# Patient Record
Sex: Female | Born: 1955 | Race: White | Hispanic: No | Marital: Married | Smoking: Never smoker
Health system: Southern US, Community
[De-identification: ages and names within clinical notes are randomized; demographics above are authoritative.]

## PROBLEM LIST (undated history)

## (undated) DIAGNOSIS — M797 Fibromyalgia: Secondary | ICD-10-CM

## (undated) DIAGNOSIS — M47819 Spondylosis without myelopathy or radiculopathy, site unspecified: Secondary | ICD-10-CM

## (undated) DIAGNOSIS — I10 Essential (primary) hypertension: Secondary | ICD-10-CM

## (undated) DIAGNOSIS — R519 Headache, unspecified: Secondary | ICD-10-CM

## (undated) DIAGNOSIS — R51 Headache: Secondary | ICD-10-CM

## (undated) DIAGNOSIS — H5501 Congenital nystagmus: Secondary | ICD-10-CM

## (undated) DIAGNOSIS — G894 Chronic pain syndrome: Secondary | ICD-10-CM

## (undated) DIAGNOSIS — E7800 Pure hypercholesterolemia, unspecified: Secondary | ICD-10-CM

## (undated) DIAGNOSIS — G8929 Other chronic pain: Secondary | ICD-10-CM

## (undated) DIAGNOSIS — F419 Anxiety disorder, unspecified: Secondary | ICD-10-CM

## (undated) DIAGNOSIS — M5136 Other intervertebral disc degeneration, lumbar region: Secondary | ICD-10-CM

## (undated) DIAGNOSIS — M7500 Adhesive capsulitis of unspecified shoulder: Secondary | ICD-10-CM

## (undated) DIAGNOSIS — E559 Vitamin D deficiency, unspecified: Secondary | ICD-10-CM

## (undated) DIAGNOSIS — G43009 Migraine without aura, not intractable, without status migrainosus: Secondary | ICD-10-CM

## (undated) DIAGNOSIS — M48061 Spinal stenosis, lumbar region without neurogenic claudication: Secondary | ICD-10-CM

## (undated) DIAGNOSIS — W19XXXA Unspecified fall, initial encounter: Secondary | ICD-10-CM

## (undated) HISTORY — DX: Spondylosis without myelopathy or radiculopathy, site unspecified: M47.819

## (undated) HISTORY — DX: Other intervertebral disc degeneration, lumbar region: M51.36

## (undated) HISTORY — PX: ENDOMETRIAL ABLATION: SHX621

## (undated) HISTORY — DX: Essential (primary) hypertension: I10

## (undated) HISTORY — DX: Anxiety disorder, unspecified: F41.9

## (undated) HISTORY — DX: Unspecified fall, initial encounter: W19.XXXA

## (undated) HISTORY — DX: Headache, unspecified: R51.9

## (undated) HISTORY — DX: Fibromyalgia: M79.7

## (undated) HISTORY — DX: Migraine without aura, not intractable, without status migrainosus: G43.009

## (undated) HISTORY — DX: Pure hypercholesterolemia, unspecified: E78.00

## (undated) HISTORY — DX: Chronic pain syndrome: G89.4

## (undated) HISTORY — DX: Headache: R51

## (undated) HISTORY — DX: Congenital nystagmus: H55.01

## (undated) HISTORY — DX: Spinal stenosis, lumbar region without neurogenic claudication: M48.061

## (undated) HISTORY — DX: Adhesive capsulitis of unspecified shoulder: M75.00

## (undated) HISTORY — DX: Other chronic pain: G89.29

## (undated) HISTORY — DX: Vitamin D deficiency, unspecified: E55.9

## (undated) HISTORY — PX: TONSILLECTOMY AND ADENOIDECTOMY: SUR1326

---

## 2010-11-05 DIAGNOSIS — E7800 Pure hypercholesterolemia, unspecified: Secondary | ICD-10-CM | POA: Insufficient documentation

## 2010-11-05 DIAGNOSIS — E559 Vitamin D deficiency, unspecified: Secondary | ICD-10-CM

## 2010-11-05 DIAGNOSIS — M797 Fibromyalgia: Secondary | ICD-10-CM | POA: Insufficient documentation

## 2010-11-05 DIAGNOSIS — I10 Essential (primary) hypertension: Secondary | ICD-10-CM | POA: Insufficient documentation

## 2010-11-05 HISTORY — DX: Fibromyalgia: M79.7

## 2010-11-05 HISTORY — DX: Vitamin D deficiency, unspecified: E55.9

## 2010-11-05 HISTORY — DX: Pure hypercholesterolemia, unspecified: E78.00

## 2011-02-11 DIAGNOSIS — F419 Anxiety disorder, unspecified: Secondary | ICD-10-CM

## 2011-02-11 HISTORY — DX: Anxiety disorder, unspecified: F41.9

## 2011-05-29 DIAGNOSIS — F4321 Adjustment disorder with depressed mood: Secondary | ICD-10-CM | POA: Insufficient documentation

## 2014-12-03 ENCOUNTER — Other Ambulatory Visit: Payer: Self-pay | Admitting: Orthopedic Surgery

## 2014-12-03 DIAGNOSIS — G8929 Other chronic pain: Secondary | ICD-10-CM

## 2014-12-03 DIAGNOSIS — M545 Low back pain: Principal | ICD-10-CM

## 2014-12-14 ENCOUNTER — Other Ambulatory Visit: Payer: Self-pay | Admitting: Orthopedic Surgery

## 2014-12-14 ENCOUNTER — Ambulatory Visit
Admission: RE | Admit: 2014-12-14 | Discharge: 2014-12-14 | Disposition: A | Discharge status: Home / Self Care | Payer: Worker's Compensation | Source: Ambulatory Visit | Attending: Orthopedic Surgery | Admitting: Orthopedic Surgery

## 2014-12-14 DIAGNOSIS — M545 Low back pain: Principal | ICD-10-CM

## 2014-12-14 DIAGNOSIS — G8929 Other chronic pain: Secondary | ICD-10-CM

## 2014-12-14 MED ORDER — IOHEXOL 180 MG/ML  SOLN
1.0000 mL | Freq: Once | INTRAMUSCULAR | Status: DC | PRN
  Administered 2014-12-14: 1 mL via INTRA_ARTICULAR

## 2014-12-14 MED ORDER — METHYLPREDNISOLONE ACETATE 40 MG/ML INJ SUSP (RADIOLOG
120.0000 mg | Freq: Once | INTRAMUSCULAR | Status: AC
  Administered 2014-12-14: 120 mg via INTRA_ARTICULAR

## 2014-12-14 NOTE — Discharge Instructions (Signed)

## 2015-01-11 ENCOUNTER — Other Ambulatory Visit: Payer: Self-pay | Admitting: Orthopedic Surgery

## 2015-01-11 DIAGNOSIS — M48061 Spinal stenosis, lumbar region without neurogenic claudication: Secondary | ICD-10-CM

## 2015-01-23 ENCOUNTER — Ambulatory Visit
Admission: RE | Admit: 2015-01-23 | Discharge: 2015-01-23 | Disposition: A | Discharge status: Home / Self Care | Payer: Worker's Compensation | Source: Ambulatory Visit | Attending: Orthopedic Surgery | Admitting: Orthopedic Surgery

## 2015-01-23 ENCOUNTER — Other Ambulatory Visit: Payer: Self-pay | Admitting: Orthopedic Surgery

## 2015-01-23 DIAGNOSIS — M48061 Spinal stenosis, lumbar region without neurogenic claudication: Secondary | ICD-10-CM

## 2015-01-23 NOTE — Discharge Instructions (Signed)

## 2015-09-23 ENCOUNTER — Ambulatory Visit (INDEPENDENT_AMBULATORY_CARE_PROVIDER_SITE_OTHER): Payer: BLUE CROSS/BLUE SHIELD | Admitting: Physician Assistant

## 2015-09-23 ENCOUNTER — Encounter: Payer: Self-pay | Admitting: Physician Assistant

## 2015-09-23 ENCOUNTER — Ambulatory Visit (INDEPENDENT_AMBULATORY_CARE_PROVIDER_SITE_OTHER): Payer: BLUE CROSS/BLUE SHIELD

## 2015-09-23 VITALS — BP 162/87 | HR 98 | Ht 64.0 in | Wt 248.0 lb

## 2015-09-23 DIAGNOSIS — M48061 Spinal stenosis, lumbar region without neurogenic claudication: Secondary | ICD-10-CM

## 2015-09-23 DIAGNOSIS — W19XXXA Unspecified fall, initial encounter: Secondary | ICD-10-CM

## 2015-09-23 DIAGNOSIS — M79601 Pain in right arm: Secondary | ICD-10-CM

## 2015-09-23 DIAGNOSIS — M25511 Pain in right shoulder: Secondary | ICD-10-CM | POA: Diagnosis not present

## 2015-09-23 DIAGNOSIS — M7531 Calcific tendinitis of right shoulder: Secondary | ICD-10-CM | POA: Diagnosis not present

## 2015-09-23 DIAGNOSIS — M7501 Adhesive capsulitis of right shoulder: Secondary | ICD-10-CM

## 2015-09-23 DIAGNOSIS — M47819 Spondylosis without myelopathy or radiculopathy, site unspecified: Secondary | ICD-10-CM

## 2015-09-23 DIAGNOSIS — M4806 Spinal stenosis, lumbar region: Secondary | ICD-10-CM

## 2015-09-23 DIAGNOSIS — G8929 Other chronic pain: Secondary | ICD-10-CM

## 2015-09-23 DIAGNOSIS — G894 Chronic pain syndrome: Secondary | ICD-10-CM

## 2015-09-23 DIAGNOSIS — M5136 Other intervertebral disc degeneration, lumbar region: Secondary | ICD-10-CM

## 2015-09-23 DIAGNOSIS — M533 Sacrococcygeal disorders, not elsewhere classified: Secondary | ICD-10-CM

## 2015-09-23 MED ORDER — HYDROCODONE-ACETAMINOPHEN 5-325 MG PO TABS: 1.0000 | ORAL_TABLET | Freq: Three times a day (TID) | ORAL | Status: DC | PRN

## 2015-09-23 MED ORDER — DICLOFENAC SODIUM 75 MG PO TBEC: 75.0000 mg | DELAYED_RELEASE_TABLET | Freq: Two times a day (BID) | ORAL | Status: DC

## 2015-09-23 NOTE — Patient Instructions (Signed)
Adhesive Capsulitis Adhesive capsulitis is inflammation of the tendons and ligaments that surround the shoulder joint (shoulder capsule). This condition causes the shoulder to become stiff and painful to move. Adhesive capsulitis is also called frozen shoulder. CAUSES This condition may be caused by:  An injury to the shoulder joint.  Straining the shoulder.  Not moving the shoulder for a period of time. This can happen if your arm was injured or in a sling.  Long-standing health problems, such as:  Diabetes.  Thyroid problems.  Heart disease.  Stroke.  Rheumatoid arthritis.  Lung disease. In some cases, the cause may not be known. RISK FACTORS This condition is more likely to develop in:  Women.  People who are older than 58 years of age. SYMPTOMS Symptoms of this condition include:  Pain in the shoulder when moving the arm. There may also be pain when parts of the shoulder are touched. The pain is worse at night or when at rest.  Soreness or aching in the shoulder.  Inability to move the shoulder normally.  Muscle spasms. DIAGNOSIS This condition is diagnosed with a physical exam and imaging tests, such as an X-ray or MRI. TREATMENT This condition may be treated with:  Treatment of the underlying cause or condition.  Physical therapy. This involves performing exercises to get the shoulder moving again.  Medicine. Medicine may be given to relieve pain, inflammation, or muscle spasms.  Steroid injections into the shoulder joint.  Shoulder manipulation. This is a procedure to move the shoulder into another position. It is done after you are given a medicine to make you fall asleep (general anesthetic). The joint may also be injected with salt water at high pressure to break down scarring.  Surgery. This may be done in severe cases when other treatments have failed. Although most people recover completely from adhesive capsulitis, some may not regain the full  movement of the shoulder. HOME CARE INSTRUCTIONS  Take over-the-counter and prescription medicines only as told by your health care provider.  If you are being treated with physical therapy, follow instructions from your physical therapist.  Avoid exercises that put a lot of demand on your shoulder, such as throwing. These exercises can make pain worse.  If directed, apply ice to the injured area:  Put ice in a plastic bag.  Place a towel between your skin and the bag.  Leave the ice on for 20 minutes, 2-3 times per day. SEEK MEDICAL CARE IF:  You develop new symptoms.  Your symptoms get worse.   This information is not intended to replace advice given to you by your health care provider. Make sure you discuss any questions you have with your health care provider.   Document Released: 12/28/2008 Document Revised: 11/21/2014 Document Reviewed: 06/25/2014 Elsevier Interactive Patient Education 2016 [employer]vier Inc[employer]  

## 2015-09-23 NOTE — Progress Notes (Signed)
Subjective:    Patient ID: Sheri King, female    DOB: [birth year], 60 y.o.   MRN: [id number]  HPI  Pt is a 60 yo female who presents to the clinic to establish care.   .. Active Ambulatory Problems    Diagnosis Date Noted  . Anxiety 02/11/2011  . Fibromyalgia 11/05/2010  . Aggrieved 05/29/2011  . Pure hypercholesterolemia 11/05/2010  . Essential (primary) hypertension 11/05/2010  . Avitaminosis D 11/05/2010   Resolved Ambulatory Problems    Diagnosis Date Noted  . No Resolved Ambulatory Problems   Past Medical History  Diagnosis Date  . Hypertension    .Marland Kitchen Family History  Problem Relation Age of Onset  . Diabetes Mother    .Marland Kitchen Social History   Social History  . Marital Status: Married    Spouse Name: N/A  . Number of Children: N/A  . Years of Education: N/A   Occupational History  . Not on file.   Social History Main Topics  . Smoking status: Never Smoker   . Smokeless tobacco: Never Used  . Alcohol Use: No  . Drug Use: No  . Sexual Activity: Not on file   Other Topics Concern  . Not on file   Social History Narrative   She comes in today with complaint of right shoulder pain and upper arm pain after fall 4 days ago in the bathtub. She lost of footing and fel forward and hit front right shoulder. She has been in pain since. Ibuprofen is not helping. She has not moved her shoulder for 4 days. Rates pain 10/10. All movement is painful.     Review of Systems  All other systems reviewed and are negative.      Objective:   Physical Exam  Constitutional: She is oriented to person, place, and time. She appears well-developed and well-nourished.  HENT:  Head: Normocephalic and atraumatic.  Cardiovascular: Normal rate, regular rhythm and normal heart sounds.   Pulmonary/Chest: Effort normal and breath sounds normal.  Musculoskeletal:  Right shoulder: Active ROM is decreased signifcantly to about 20-40 degrees.  Passive ROm is decreased due to pain at  about 80 degrees. Resistance met with effort to create full ROM.  Pain to palpation over anterior shoulder and acromion.  Strength decreased 3/5 of right arm.   Neurological: She is alert and oriented to person, place, and time.  Psychiatric: She has a normal mood and affect. Her behavior is normal.          Assessment & Plan:  Fall/right shoulder pain/right arm pain- xray was negative for fracture or dislocation. Exam was positive for signs of adhesive capsulitis. She does admit to not moving shoulder since fall 4 days ago. Exercises given. Encouraged her to follow up with sports medicine and get an injection. Offered PT and declined today. Diclofenac bid for inflammation. She was persistent about needing pain medication. She admited to going to pain clinic but that she could not be seen until august. norco given 30 tablets and explained there would be no refills.  Follow up if not improving in 2 weeks with sports medicine.   Check registry and no controlled substances other than butran in registry.   Hypertension- elevated today. Per pt she feels like due to pain. maxide and verapamil to continue.   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  Per record she is only on Butrans for  pain. I worry that she will put her pain contract at risk with taking hydrocodone. Will call patient and encourage her to communicate this with her pain clinic.

## 2015-09-25 ENCOUNTER — Telehealth: Payer: Self-pay | Admitting: Physician Assistant

## 2015-09-25 DIAGNOSIS — M79601 Pain in right arm: Secondary | ICD-10-CM | POA: Insufficient documentation

## 2015-09-25 DIAGNOSIS — M5136 Other intervertebral disc degeneration, lumbar region: Secondary | ICD-10-CM | POA: Insufficient documentation

## 2015-09-25 DIAGNOSIS — M47816 Spondylosis without myelopathy or radiculopathy, lumbar region: Secondary | ICD-10-CM | POA: Insufficient documentation

## 2015-09-25 DIAGNOSIS — M25511 Pain in right shoulder: Secondary | ICD-10-CM | POA: Insufficient documentation

## 2015-09-25 DIAGNOSIS — W19XXXA Unspecified fall, initial encounter: Secondary | ICD-10-CM | POA: Insufficient documentation

## 2015-09-25 DIAGNOSIS — M7500 Adhesive capsulitis of unspecified shoulder: Secondary | ICD-10-CM

## 2015-09-25 DIAGNOSIS — M47819 Spondylosis without myelopathy or radiculopathy, site unspecified: Secondary | ICD-10-CM

## 2015-09-25 DIAGNOSIS — M533 Sacrococcygeal disorders, not elsewhere classified: Secondary | ICD-10-CM

## 2015-09-25 DIAGNOSIS — G8929 Other chronic pain: Secondary | ICD-10-CM | POA: Insufficient documentation

## 2015-09-25 DIAGNOSIS — G894 Chronic pain syndrome: Secondary | ICD-10-CM

## 2015-09-25 DIAGNOSIS — M48061 Spinal stenosis, lumbar region without neurogenic claudication: Secondary | ICD-10-CM | POA: Insufficient documentation

## 2015-09-25 HISTORY — DX: Chronic pain syndrome: G89.4

## 2015-09-25 HISTORY — DX: Spondylosis without myelopathy or radiculopathy, site unspecified: M47.819

## 2015-09-25 HISTORY — DX: Spinal stenosis, lumbar region without neurogenic claudication: M48.061

## 2015-09-25 HISTORY — DX: Other intervertebral disc degeneration, lumbar region: M51.36

## 2015-09-25 HISTORY — DX: Unspecified fall, initial encounter: W19.XXXA

## 2015-09-25 HISTORY — DX: Adhesive capsulitis of unspecified shoulder: M75.00

## 2015-09-25 NOTE — Telephone Encounter (Signed)
Patient notified and voiced understanding.

## 2015-09-25 NOTE — Telephone Encounter (Signed)
Call pt: after looking in care everywhere I see where you have a pain contract with Dr[facility]. At your visit here you were given a small quanity of norco for acute pain. I am concerned they will check registry at next visit and release you. You may need to call them and let them know of situation. When in pain contract you cannot get any other pain medications from other providers.

## 2015-10-21 ENCOUNTER — Other Ambulatory Visit: Payer: Self-pay | Admitting: Physician Assistant

## 2015-10-22 ENCOUNTER — Telehealth: Payer: Self-pay | Admitting: *Deleted

## 2015-10-22 NOTE — Telephone Encounter (Signed)
PA submitted for verapamil. Faxing form to our office

## 2015-11-07 NOTE — Telephone Encounter (Signed)
Form never received. Called rep and he showed me how to download form. Filled it out and faxed

## 2015-11-19 ENCOUNTER — Other Ambulatory Visit: Payer: Self-pay | Admitting: Physician Assistant

## 2016-01-12 ENCOUNTER — Other Ambulatory Visit: Payer: Self-pay | Admitting: Physician Assistant

## 2016-02-27 ENCOUNTER — Encounter: Payer: Self-pay | Admitting: Sports Medicine

## 2016-02-27 ENCOUNTER — Ambulatory Visit (INDEPENDENT_AMBULATORY_CARE_PROVIDER_SITE_OTHER): Payer: BLUE CROSS/BLUE SHIELD | Admitting: Sports Medicine

## 2016-02-27 DIAGNOSIS — A084 Viral intestinal infection, unspecified: Secondary | ICD-10-CM

## 2016-02-27 MED ORDER — DIPHENOXYLATE-ATROPINE 2.5-0.025 MG PO TABS
ORAL_TABLET | ORAL | 3 refills | Status: DC
Start: 2016-02-27 — End: 2016-10-09

## 2016-02-27 MED ORDER — PROMETHAZINE HCL 25 MG PO TABS: 25.0000 mg | ORAL_TABLET | Freq: Four times a day (QID) | ORAL | 3 refills | Status: DC | PRN

## 2016-02-27 MED ORDER — ONDANSETRON 8 MG PO TBDP: 8.0000 mg | ORAL_TABLET | Freq: Three times a day (TID) | ORAL | 3 refills | Status: DC | PRN

## 2016-02-27 MED ORDER — TRAMADOL HCL 50 MG PO TABS: ORAL_TABLET | ORAL | 0 refills | Status: DC

## 2016-02-27 NOTE — Assessment & Plan Note (Signed)
Overall benign exam, not dehydrated. Phenergan, Zofran, tramadol for her headache, Lomotil for diarrhea. Return if no better in 2 weeks.

## 2016-02-27 NOTE — Progress Notes (Signed)
  Subjective:    CC: Nausea, vomiting, diarrhea  HPI: For the past 2 weeks this pleasant 60 year old female has had mild nausea, vomiting, diarrhea, several times per day, no hematochezia, hematemesis, no melena. Low-grade subjective fevers, headache, mild body aches. Only minimal abdominal pain. Symptoms are moderate, persistent.  Past medical history:  Negative.  See flowsheet/record as well for more information.  Surgical history: Negative.  See flowsheet/record as well for more information.  Family history: Negative.  See flowsheet/record as well for more information.  Social history: Negative.  See flowsheet/record as well for more information.  Allergies, and medications have been entered into the medical record, reviewed, and no changes needed.   Review of Systems: No fevers, chills, night sweats, weight loss, chest pain, or shortness of breath.   Objective:    General: Well Developed, well nourished, and in no acute distress.  Neuro: Alert and oriented x3, extra-ocular muscles intact, sensation grossly intact.  HEENT: Normocephalic, atraumatic, pupils equal round reactive to light, neck supple, no masses, no lymphadenopathy, thyroid nonpalpable.  Skin: Warm and dry, no rashes. Cardiac: Regular rate and rhythm, no murmurs rubs or gallops, no lower extremity edema.  Respiratory: Clear to auscultation bilaterally. Not using accessory muscles, speaking in full sentences. Abdomen: Diffusely tender but mild, no guarding, rigidity, rebound tenderness, no palpable masses, no bowel sounds, no costovertebral angle pain.  Impression and Recommendations:    Viral gastroenteritis Overall benign exam, not dehydrated. Phenergan, Zofran, tramadol for her headache, Lomotil for diarrhea. Return if no better in 2 weeks.  I spent 25 minutes with this patient, greater than 50% was face-to-face time counseling regarding the above diagnoses

## 2016-03-04 ENCOUNTER — Telehealth: Payer: Self-pay | Admitting: Sports Medicine

## 2016-03-04 ENCOUNTER — Other Ambulatory Visit: Payer: Self-pay | Admitting: Physician Assistant

## 2016-03-04 DIAGNOSIS — A084 Viral intestinal infection, unspecified: Secondary | ICD-10-CM

## 2016-03-04 MED ORDER — TRAMADOL HCL 50 MG PO TABS: ORAL_TABLET | ORAL | 0 refills | Status: DC

## 2016-03-04 NOTE — Telephone Encounter (Signed)
Mrs. Sheri King is wanting to get a refill on tramadol b/c she said she is having slight relief but she is still consistent with the headaches and diarrhea, and upset stomach. Contact patient ASAP to discuss her questions.

## 2016-03-04 NOTE — Telephone Encounter (Signed)
Please encourage pt that viral gastroenteritis symptoms can last up to 1-2 weeks. Stay hydrated. Will fax tramadol to pharmacy.

## 2016-03-04 NOTE — Telephone Encounter (Signed)
To PCP.  Though its only been a few days and this can last a week or 2.

## 2016-05-01 ENCOUNTER — Ambulatory Visit (INDEPENDENT_AMBULATORY_CARE_PROVIDER_SITE_OTHER): Payer: BLUE CROSS/BLUE SHIELD | Admitting: Physician Assistant

## 2016-05-01 ENCOUNTER — Encounter: Payer: Self-pay | Admitting: Physician Assistant

## 2016-05-01 DIAGNOSIS — H5501 Congenital nystagmus: Secondary | ICD-10-CM

## 2016-05-01 DIAGNOSIS — G43019 Migraine without aura, intractable, without status migrainosus: Secondary | ICD-10-CM | POA: Diagnosis not present

## 2016-05-01 HISTORY — DX: Congenital nystagmus: H55.01

## 2016-05-01 MED ORDER — DEXAMETHASONE SODIUM PHOSPHATE 4 MG/ML IJ SOLN
4.0000 mg | Freq: Once | INTRAMUSCULAR | Status: AC
  Administered 2016-05-01: 4 mg via INTRAMUSCULAR

## 2016-05-01 MED ORDER — KETOROLAC TROMETHAMINE 30 MG/ML IJ SOLN
30.0000 mg | Freq: Once | INTRAMUSCULAR | Status: AC
  Administered 2016-05-01: 30 mg via INTRAMUSCULAR

## 2016-05-01 MED ORDER — TRAMADOL HCL 50 MG PO TABS: ORAL_TABLET | ORAL | 0 refills | Status: DC

## 2016-05-01 MED ORDER — SUMATRIPTAN SUCCINATE 6 MG/0.5ML ~~LOC~~ SOLN
6.0000 mg | Freq: Once | SUBCUTANEOUS | Status: AC
  Administered 2016-05-01: 6 mg via SUBCUTANEOUS

## 2016-05-01 NOTE — Patient Instructions (Signed)

## 2016-05-01 NOTE — Progress Notes (Addendum)
Subjective:     Patient ID: Sheri King, female   DOB: [birth year], 61 y.o.   MRN: [id number]  HPI  Patient states she's had a headache for 3 days without relief. Has taken 800 mg Advil, 1000 mg Tylenol, and 650 mg Aspirin without any relief. She has also tried cold compresses, massages, and warm showers. She states the headache started behind her eyes and forehead and progressed to the back of her head and neck. She says the onset of the headache was unprovoked and rates it as a 10/10. Has a history of migraines in the past however has been off preventative therapy for several years. Recently discontinued Amitriptyline for fibromyalgia. Endorses photophobia and phonophobia but denies visual changes, nausea, vomiting. States Toradol has been administered in the past without relief.  Active Ambulatory Problems    Diagnosis Date Noted  . Anxiety 02/11/2011  . Fibromyalgia 11/05/2010  . Aggrieved 05/29/2011  . Pure hypercholesterolemia 11/05/2010  . Essential (primary) hypertension 11/05/2010  . Avitaminosis D 11/05/2010  . Right arm pain 09/25/2015  . Right shoulder pain 09/25/2015  . Fall 09/25/2015  . Facet arthropathy of spine 09/25/2015  . 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 DDD (degenerative disc disease), lumbar 09/25/2015  . Spinal stenosis of lumbar region 09/25/2015  . Chronic pain syndrome 09/25/2015  . Adhesive capsulitis 09/25/2015  . Viral gastroenteritis 02/27/2016  . 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 No Resolved Ambulatory Problems   Past Medical History:  Diagnosis Date  . Hypertension     Review of Systems  Constitutional: Negative for chills, diaphoresis, fatigue, fever and unexpected weight change.  HENT: Negative for congestion, facial swelling, sinus pain, sinus pressure, sneezing and tinnitus.   Eyes: Positive for photophobia and pain. Negative for discharge and visual disturbance.   Respiratory: Negative for cough, chest tightness, shortness of breath and wheezing.   Cardiovascular: Negative for chest pain and palpitations.  Gastrointestinal: Negative for blood in stool, diarrhea, nausea and vomiting.  Musculoskeletal: Positive for neck pain. Negative for arthralgias, gait problem, myalgias and neck stiffness.  Skin: Negative for color change, rash and wound.  Neurological: Positive for headaches. Negative for dizziness, tremors, seizures, syncope, speech difficulty, weakness, light-headedness and numbness.       Objective:   Physical Exam  Constitutional: She is oriented to person, place, and time. She appears well-developed and well-nourished.  Somewhat distressed and sitting with lights partially off.  HENT:  Head: Normocephalic and atraumatic.  Right Ear: External ear normal.  Left Ear: External ear normal.  Nose: Nose normal. Right sinus exhibits no maxillary sinus tenderness and no frontal sinus tenderness. Left sinus exhibits no maxillary sinus tenderness and no frontal sinus tenderness.  Mouth/Throat: Uvula is midline and oropharynx is clear and moist.  Eyes: Conjunctivae are normal. Pupils are equal, round, and reactive to light. Right eye exhibits nystagmus. Left eye exhibits nystagmus.  Neck: Normal range of motion. Neck supple.  Cardiovascular: Normal rate and regular rhythm.  Exam reveals no gallop and no friction rub.   No murmur heard. Pulmonary/Chest: Effort normal and breath sounds normal. She has no wheezes. She has no rales.  Abdominal: Soft. Bowel sounds are normal. She exhibits no distension. There is no tenderness. There is no guarding.  Musculoskeletal: Normal range of motion.  Muscle strength 5/5 on bilateral upper and lower extremities.  Lymphadenopathy:    She has no cervical adenopathy.  Neurological: She is alert and  oriented to person, place, and time. She has normal reflexes. No cranial nerve deficit. Coordination normal.  Skin: Skin  is warm and dry. No rash noted. No erythema.  Psychiatric: She has a normal mood and affect. Her behavior is normal.     Assessment/Plan:  Sheri King was seen today for headache.  Diagnoses and all orders for this visit:  Intractable migraine without aura and without status migrainosus -     traMADol (ULTRAM) 50 MG tablet; 1-2 tabs by mouth Q8 hours, maximum 6 tabs per day. -     ketorolac (TORADOL) 30 MG/ML injection 30 mg; Inject 1 mL (30 mg total) into the muscle once. -     dexamethasone (DECADRON) injection 4 mg; Inject 1 mL (4 mg total) into the muscle once. -     SUMAtriptan (IMITREX) injection 6 mg; Inject 0.5 mLs (6 mg total) into the skin once.  No focal neurologic deficits noted or reported at this time so this is most likely a migraine given her history and symptoms. Administered Ketorolac, Dexamethasone, and Imitrex in office to help with pain and sent home with Tramadol given history of not responding well to Ketorolac. Patient instructed to go to emergency department if she notices any numbness, weakness, facial droop, difficulty speaking, dizziness, gait instability, or any other neurologic deficits. Also informed to seek help if pain increases or does not go away.  Discussed if she experienced another migraine in the next month she should consider restarting her Amitriptyline as prophylactic therapy.

## 2016-05-26 ENCOUNTER — Other Ambulatory Visit: Payer: Self-pay | Admitting: Physician Assistant

## 2016-07-02 ENCOUNTER — Other Ambulatory Visit: Payer: Self-pay | Admitting: *Deleted

## 2016-07-02 MED ORDER — VERAPAMIL HCL ER 300 MG PO CP24: ORAL_CAPSULE | ORAL | 0 refills | Status: DC

## 2016-07-10 ENCOUNTER — Encounter: Payer: Self-pay | Admitting: *Deleted

## 2016-07-10 ENCOUNTER — Emergency Department
Admission: EM | Admit: 2016-07-10 | Discharge: 2016-07-10 | Disposition: A | Discharge status: Home / Self Care | Payer: BLUE CROSS/BLUE SHIELD | Source: Home / Self Care | Attending: Family Medicine | Admitting: Family Medicine

## 2016-07-10 DIAGNOSIS — R519 Headache, unspecified: Secondary | ICD-10-CM

## 2016-07-10 DIAGNOSIS — R197 Diarrhea, unspecified: Secondary | ICD-10-CM | POA: Diagnosis not present

## 2016-07-10 DIAGNOSIS — A084 Viral intestinal infection, unspecified: Secondary | ICD-10-CM

## 2016-07-10 DIAGNOSIS — R51 Headache: Secondary | ICD-10-CM

## 2016-07-10 DIAGNOSIS — R112 Nausea with vomiting, unspecified: Secondary | ICD-10-CM | POA: Diagnosis not present

## 2016-07-10 LAB — POCT CBC W AUTO DIFF (K'VILLE URGENT CARE)

## 2016-07-10 MED ORDER — METOCLOPRAMIDE HCL 5 MG/ML IJ SOLN
5.0000 mg | Freq: Once | INTRAMUSCULAR | Status: AC
  Administered 2016-07-10: 5 mg via INTRAMUSCULAR

## 2016-07-10 MED ORDER — KETOROLAC TROMETHAMINE 10 MG PO TABS: 10.0000 mg | ORAL_TABLET | Freq: Four times a day (QID) | ORAL | 0 refills | Status: DC | PRN

## 2016-07-10 MED ORDER — KETOROLAC TROMETHAMINE 60 MG/2ML IJ SOLN
60.0000 mg | Freq: Once | INTRAMUSCULAR | Status: AC
  Administered 2016-07-10: 60 mg via INTRAMUSCULAR

## 2016-07-10 MED ORDER — PROMETHAZINE HCL 25 MG PO TABS: 25.0000 mg | ORAL_TABLET | Freq: Four times a day (QID) | ORAL | 0 refills | Status: DC | PRN

## 2016-07-10 MED ORDER — DEXAMETHASONE SODIUM PHOSPHATE 10 MG/ML IJ SOLN
10.0000 mg | Freq: Once | INTRAMUSCULAR | Status: AC
  Administered 2016-07-10: 10 mg via INTRAMUSCULAR

## 2016-07-10 NOTE — ED Provider Notes (Signed)
[facility]    CSN: [id number] Arrival date & time: 07/10/16  1157     History   Chief Complaint Chief Complaint  Patient presents with  . Abdominal Pain  . Diarrhea  . Sinus Problem  . Headache    HPI Sheri King is a 61 y.o. female.   Three days ago patient developed headache, nausea/vomiting, diarrhea, and chills.  The vomiting resolved about one hour.  She has now developed sinus congestion, cough, and her ears hurt.  She has a history of seasonal rhinitis.   The history is provided by the patient.    Past Medical History:  Diagnosis Date  . Hypertension     Patient Active Problem List   Diagnosis Date Noted  . 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 Viral gastroenteritis 02/27/2016  . Right arm pain 09/25/2015  . Right shoulder pain 09/25/2015  . Fall 09/25/2015  . Facet arthropathy of spine (HCC) 09/25/2015  . 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 DDD (degenerative disc disease), lumbar 09/25/2015  . Spinal stenosis of lumbar region 09/25/2015  . Chronic pain syndrome 09/25/2015  . Adhesive capsulitis 09/25/2015  . Aggrieved 05/29/2011  . Anxiety 02/11/2011  . Fibromyalgia 11/05/2010  . Pure hypercholesterolemia 11/05/2010  . Essential (primary) hypertension 11/05/2010  . Avitaminosis D 11/05/2010    Past Surgical History:  Procedure Laterality Date  . TONSILLECTOMY AND ADENOIDECTOMY     at age 5    OB History    No data available       Home Medications    Prior to Admission medications   Medication Sig Start Date End Date Taking? Authorizing Provider  amitriptyline (ELAVIL) 150 MG tablet  05/16/15   Historical Provider, MD  buprenorphine (BUTRANS) 10 MCG/HR PTWK patch Place 10 mcg onto the skin once a week.    Historical Provider, MD  diclofenac (VOLTAREN) 75 MG EC tablet TAKE 1 TABLET BY MOUTH TWICE A DAY 01/13/16   Jade L Breeback, PA-C  diphenoxylate-atropine (LOMOTIL) 2.5-0.025 MG tablet One to 2 tablets by mouth 4  times a day as needed for diarrhea. 02/27/16   Monica Becton, MD  Eszopiclone 3 MG TABS Take 3 mg by mouth at bedtime as needed. 09/12/15   Historical Provider, MD  ketorolac (TORADOL) 10 MG tablet Take 1 tablet (10 mg total) by mouth every 6 (six) hours as needed. 07/10/16   Lattie Haw, MD  ondansetron (ZOFRAN-ODT) 8 MG disintegrating tablet Take 1 tablet (8 mg total) by mouth every 8 (eight) hours as needed for nausea. 02/27/16   Monica Becton, MD  promethazine (PHENERGAN) 25 MG tablet Take 1 tablet (25 mg total) by mouth every 6 (six) hours as needed for nausea. 07/10/16   Lattie Haw, MD  traMADol (ULTRAM) 50 MG tablet 1-2 tabs by mouth Q8 hours, maximum 6 tabs per day. 05/01/16   Jomarie Longs, PA-C  triamterene-hydrochlorothiazide (MAXZIDE-25) 37.5-25 MG tablet  09/21/15   Historical Provider, MD  Verapamil HCl CR 300 MG CP24 TAKE ONE CAPSULE BY MOUTH EVERY MORNING BEFORE BREAKFAST 07/02/16   Jomarie Longs, PA-C    Family History Family History  Problem Relation Age of Onset  . Diabetes Mother     Social History Social History  Substance Use Topics  . Smoking status: Never Smoker  . Smokeless tobacco: Never Used  . Alcohol use No     Allergies   Tramadol; Codeine; Latex; Nexium [esomeprazole magnesium]; and Nsaids   Review of  Systems Review of Systems No sore throat + mild cough No pleuritic pain No wheezing + nasal congestion + post-nasal drainage + sinus pain/pressure No itchy/red eyes ? earache No hemoptysis No SOB No fever, + chills + nausea + vomiting resolved No abdominal pain + diarrhea No urinary symptoms No skin rash + fatigue No myalgias + headache Used OTC meds without relief   Physical Exam Triage Vital Signs ED Triage Vitals [07/10/16 1234]  Enc Vitals Group     BP 129/76     Pulse Rate 82     Resp      Temp 99.1 F (37.3 C)     Temp Source Oral     SpO2 96 %     Weight 257 lb (116.6 kg)     Height      Head  Circumference      Peak Flow      Pain Score 9     Pain Loc      Pain Edu?      Excl. in GC?    No data found.   Updated Vital Signs BP 129/76 (BP Location: Left Arm)   Pulse 82   Temp 99.1 F (37.3 C) (Oral)   Wt 257 lb (116.6 kg)   SpO2 96%   BMI 44.11 kg/m   Visual Acuity Right Eye Distance:   Left Eye Distance:   Bilateral Distance:    Right Eye Near:   Left Eye Near:    Bilateral Near:     Physical Exam Nursing notes and Vital Signs reviewed. Appearance:  Patient appears stated age, and in no acute distress Eyes:  Pupils are equal, round, and reactive to light and accomodation.  Extraocular movement is intact.  Conjunctivae are not inflamed  Ears:  Canals normal.  Tympanic membranes normal.  Nose:  Mildly congested turbinates.  No sinus tenderness.   Mouth/Pharynx:  Normal; moist mucous membranes  Neck:  Supple.  No adenopathy. Lungs:  Clear to auscultation.  Breath sounds are equal.  Moving air well. Heart:  Regular rate and rhythm without murmurs, rubs, or gallops.  Abdomen:  Nontender without masses or hepatosplenomegaly.  Bowel sounds are present.  No CVA or flank tenderness.  Extremities:  No edema.  Skin:  No rash present.   Neurologic:  Cranial nerves 2 through 12 are normal.  Patellar, achilles, and elbow reflexes are normal.  Cerebellar function is intact (finger-to-nose and rapid alternating hand movement).  Gait and station are normal.  Grip strength symmetric bilaterally.   UC Treatments / Results  Labs (all labs ordered are listed, but only abnormal results are displayed)  Labs Reviewed  POCT CBC W AUTO DIFF ([facility]):  WBC 9.9; LY 32.1; MO 2.9; GR 65.0; Hgb 14.1; Platelets 352    Tympanometry:  Right ear tympanogram normal; Left ear tympanogram positive peak pressure  EKG  EKG Interpretation None       Radiology No results found.  Procedures Procedures (including critical care time)  Medications Ordered in  UC Medications  ketorolac (TORADOL) injection 60 mg (60 mg Intramuscular Given 07/10/16 1426)  dexamethasone (DECADRON) injection 10 mg (10 mg Intramuscular Given 07/10/16 1426)  metoCLOPramide (REGLAN) injection 5 mg (5 mg Intramuscular Given 07/10/16 1426)     Initial Impression / Assessment and Plan / UC Course  I have reviewed the triage vital signs and the nursing notes.  Pertinent labs & imaging results that were available during my care of the patient were reviewed by  me and considered in my medical decision making (see chart for details).    Normal WBC reassuring. Suspect viral gastroenteritis. Administered Toradol  IM, Decadron  IM, and Reglan  IM.  Rx for Toradol  PO. Rx for Phenergan for nausea. Begin clear liquids (Pedialyte while having diarrhea) until improved, then advance to a [employer] (Bananas, Rice, Applesauce, Toast).  Then gradually resume a regular diet when tolerated.  Avoid milk products until well.  When stools become more formed, may take Imodium (loperamide) once or twice daily to decrease stool frequency.  If symptoms become significantly worse during the night or over the weekend, proceed to the local emergency room.  Followup with Family Doctor if not improved in 3 days.    Final Clinical Impressions(s) / UC Diagnoses   Final diagnoses:  Intractable headache, unspecified chronicity pattern, unspecified headache type  Nausea vomiting and diarrhea    New Prescriptions New Prescriptions   KETOROLAC (TORADOL) 10 MG TABLET    Take 1 tablet (10 mg total) by mouth every 6 (six) hours as needed.     Lattie Haw, MD 07/19/16 [phone]

## 2016-07-10 NOTE — Discharge Instructions (Signed)
Begin clear liquids (Pedialyte while having diarrhea) until improved, then advance to a BRAT diet (Bananas, Rice, Applesauce, Toast).  Then gradually resume a regular diet when tolerated.  Avoid milk products until well.  When stools become more formed, may take Imodium (loperamide) once or twice daily to decrease stool frequency.  °If symptoms become significantly worse during the night or over the weekend, proceed to the local emergency room.  °

## 2016-07-10 NOTE — ED Triage Notes (Signed)
Pt c/o 3 days of Nausea, HA and diarrhea. 4 days of ear pain and itching, sinus pressure and congestion.

## 2016-07-11 ENCOUNTER — Telehealth: Payer: Self-pay | Admitting: Emergency Medicine

## 2016-07-11 MED ORDER — PREDNISONE 20 MG PO TABS: ORAL_TABLET | ORAL | 0 refills | Status: DC

## 2016-07-11 NOTE — Telephone Encounter (Signed)
Pt called requesting Ultram for a headache.  I have not seen this patient before. I encourage patient to follow up with primary care for Ultram. In meantime, Prednisone sent to pharmacy. Recommend also trying Excedrin migraine.  If not improving, or symptoms continue to worsen, advised to go to emergency department.

## 2016-07-11 NOTE — Telephone Encounter (Signed)
Patient called to report medications given to her for headache have not relieved it. Asked for tramadol, which had been given to her after her last migraine 2/18 in [facility] office. Consulted with Junius Finner and was advised to call [facility] on call or go to ED; or try excedrine for migraines.

## 2016-07-13 ENCOUNTER — Other Ambulatory Visit: Payer: Self-pay

## 2016-07-13 DIAGNOSIS — G43019 Migraine without aura, intractable, without status migrainosus: Secondary | ICD-10-CM

## 2016-07-13 MED ORDER — TRAMADOL HCL 50 MG PO TABS: ORAL_TABLET | ORAL | 0 refills | Status: DC

## 2016-10-08 ENCOUNTER — Telehealth: Payer: Self-pay | Admitting: Physician Assistant

## 2016-10-08 NOTE — Telephone Encounter (Signed)
Patient called left vm need appt for headaches causing nausea a little bit of vomiting and for anxiety. Scheduled pt for 10/09/16 adv will need to bring a driver and she stated its not migraines it more like cluster pains she had for 6-8 weeks. Documented pt was informed need to bring a driver. Thanks

## 2016-10-09 ENCOUNTER — Encounter: Payer: Self-pay | Admitting: Physician Assistant

## 2016-10-09 ENCOUNTER — Ambulatory Visit (INDEPENDENT_AMBULATORY_CARE_PROVIDER_SITE_OTHER): Payer: BLUE CROSS/BLUE SHIELD | Admitting: Physician Assistant

## 2016-10-09 VITALS — BP 130/82 | HR 77 | Temp 98.5°F | Resp 18 | Wt 249.0 lb

## 2016-10-09 DIAGNOSIS — R109 Unspecified abdominal pain: Secondary | ICD-10-CM

## 2016-10-09 DIAGNOSIS — R110 Nausea: Secondary | ICD-10-CM

## 2016-10-09 DIAGNOSIS — H5501 Congenital nystagmus: Secondary | ICD-10-CM | POA: Diagnosis not present

## 2016-10-09 DIAGNOSIS — R519 Headache, unspecified: Secondary | ICD-10-CM

## 2016-10-09 DIAGNOSIS — R51 Headache: Secondary | ICD-10-CM | POA: Diagnosis not present

## 2016-10-09 DIAGNOSIS — F419 Anxiety disorder, unspecified: Secondary | ICD-10-CM

## 2016-10-09 LAB — POCT URINALYSIS DIPSTICK
Glucose, UA: NEGATIVE
Nitrite, UA: NEGATIVE
PH UA: 6 (ref 5.0–8.0)
PROTEIN UA: 30
Spec Grav, UA: 1.025 (ref 1.010–1.025)
Urobilinogen, UA: 1 E.U./dL

## 2016-10-09 MED ORDER — PROMETHAZINE HCL 25 MG PO TABS: 25.0000 mg | ORAL_TABLET | Freq: Four times a day (QID) | ORAL | 1 refills | Status: DC | PRN

## 2016-10-09 MED ORDER — CEFUROXIME AXETIL 250 MG PO TABS: 250.0000 mg | ORAL_TABLET | Freq: Two times a day (BID) | ORAL | 0 refills | Status: AC

## 2016-10-09 MED ORDER — DIAZEPAM 5 MG PO TABS
ORAL_TABLET | ORAL | 0 refills | Status: DC
Start: 2016-10-09 — End: 2016-12-21

## 2016-10-09 MED ORDER — TOPIRAMATE 50 MG PO TABS: ORAL_TABLET | ORAL | 1 refills | Status: DC

## 2016-10-09 NOTE — Patient Instructions (Signed)
-   You will be contacted to schedule your MRI. The MRI machine is here on Mondays - Take Valium 30 minutes before your appointment. You will need a driver

## 2016-10-09 NOTE — Progress Notes (Signed)
HPI:                                                                Sheri King is a 61 y.o. female who [facility] today for headache and nausea  Patient with history of chronic migraine, fibromyalgia, chronic pain syndrome, anxiety, and HTN presents today complaining of daily persistent headaches x 1 month. Headaches are bilateral frontotemporal and accompanied by nausea and vomiting. Pain is moderate and persistent. She states they are different from her migraines because she does not have photophobia. She denies vision change, paresthesias, weakness, gait disturbance or loss of consciousness. She has tried OTC pain relievers without relief. She states she has been on Topamax in the past for migraine prophylaxis. She is currently on both Verapamil with a thiazide for comorbid hypertension.  Patient also complains of right flank pain and urinary frequency and urgency. Denies dysuria, fever, chills, abdominal pain.   Past Medical History:  Diagnosis Date  . Adhesive capsulitis 09/25/2015  . Anxiety 02/11/2011  . Avitaminosis D 11/05/2010  . Chronic headaches   . Chronic pain syndrome 09/25/2015   [facility]    . 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 DDD (degenerative disc disease), lumbar 09/25/2015  . Facet arthropathy of spine (HCC) 09/25/2015  . Fall 09/25/2015  . Fibromyalgia 11/05/2010  . Hypertension   . Migraine headache without aura   . Pure hypercholesterolemia 11/05/2010  . Spinal stenosis of lumbar region 09/25/2015   Past Surgical History:  Procedure Laterality Date  . ENDOMETRIAL ABLATION    . TONSILLECTOMY AND ADENOIDECTOMY     at age 89   Social History  Substance Use Topics  . Smoking status: Never Smoker  . Smokeless tobacco: Never Used  . Alcohol use No   family history includes Diabetes in her mother.  ROS: negative except as noted in the HPI  Medications: Current Outpatient Prescriptions   Medication Sig Dispense Refill  . triamterene-hydrochlorothiazide (MAXZIDE-25) 37.5-25 MG tablet   4  . Verapamil HCl CR 300 MG CP24 TAKE ONE CAPSULE BY MOUTH EVERY MORNING BEFORE BREAKFAST 90 capsule 0  . cefUROXime (CEFTIN) 250 MG tablet Take 1 tablet (250 mg total) by mouth 2 (two) times daily with a meal. 14 tablet 0  . diazepam (VALIUM) 5 MG tablet Take 1 tab PO 1 hour before procedure or imaging. 2 tablet 0  . promethazine (PHENERGAN) 25 MG tablet Take 1 tablet (25 mg total) by mouth every 6 (six) hours as needed for nausea. 20 tablet 1  . topiramate (TOPAMAX) 50 MG tablet One half tab by mouth nightly for a week, then one tab by mouth nightly. 30 tablet 1   No current facility-administered medications for this visit.    Allergies  Allergen Reactions  . Tramadol Other (See Comments)    Per Sheri King- increase risk of seizures  . Codeine Itching and Rash  . Latex Itching, Swelling and Other (See Comments)    wheezing  . Nexium [Esomeprazole Magnesium] Other (See Comments)    cramp  . Nsaids Nausea Only    Gi upset       Objective:  BP 130/82   Pulse 77   Temp 98.5 F (36.9 C) (Oral)  Resp 18   Wt 249 lb (112.9 kg)   SpO2 95%   BMI 42.74 kg/m  Gen: well-groomed, morbidly obese, not ill-appearing, no acute distress HEENT: head normocephalic, atraumatic; normal conjunctiva, TM's clear, oropharynx clear, moist mucus membranes Pulm: Normal work of breathing, normal phonation, clear to auscultation bilaterally CV: Normal rate, regular rhythm, s1 and s2 distinct, no murmurs, clicks or rubs Abdomen: obese, nontender, no CVA tenderness Neuro:  cranial nerves II-XII intact, there is bilateral horizontal nystagmus, normal finger-to-nose, normal heel-to-shin, negative pronator drift, normal coordination, DTR's intact, normal tone, no tremor MSK: strength 5/5 and symmetric in bilateral upper and lower extremities, normal gait and station Mental Status: alert and oriented x 3,  normal speech, cooperative, organized thought content  No results found for this or any previous visit (from the past 72 hour(s)). No results found.  MRI Brain 11/02/2011 INDICATIONS: MENTAL STATUS CHANGE, HEADACHE, TREMOR, AUDITORY  HALLUCINATIONS, COGNITIVE AND B COMMENTS:   PROCEDURE:QMR 1035- MRI BRAIN/HEAD WO CONT - Nov 02 2011  [employer] Accession #: [id number] [employer]i Accession #: [id number]     INDICATION: MENTAL STATUS CHANGE, HEADACHE, TREMOR, AUDITORY  HALLUCINATIONS, COGNITIVE AND B.  STUDY: MRI of the head without intravenous contrast performed on  11/02/2011 3:30 PM.  COMPARISON: Head CT performed on 10/31/2011.  TECHNIQUE: Multiplanar, multisequence MR imaging of the brain was  obtained without IV contrast.   FINDINGS: *Skull/marrow/soft tissues: Grossly unremarkable. *Orbits: Unremarkable. *Sinuses: No gross abnormality. *Vessels: Normal flow voids within the major intracranial vessels. *Brain: The parenchyma, sulci, cisterns and ventricles are grossly  unremarkable. No acute infarction is identified. There is no extra-axial  fluid collection or intracranial hemorrhage. There is no mass effect  midline shift. *Additional comments: Motion artifact is degrading majority of the  imaging.    IMPRESSION: 1.Motion artifact degrading majority of the imaging. 2.No acute gross abnormality appreciated.    ___________________________________________________________ Current Report Read [id number]:Sheri [id number] on Aug 19 [id number] Transcribed byJenetta Downer: PSCB on Aug 19 20134:45P Electronically Signed by:DR. MORRY King on:Aug 19 20134:45P  Assessment and Plan: 61 y.o. female with   1. Flank pain - POCT Urinalysis Dipstick positive for trace blood and small leuks. Patient is afebrile without CVA tenderness. I suspect the flank pain is more likely her chronic low back pain related to DDD and spinal stenosis rather than pyelonephritis.  Will treat empirically for uncomplicated acute cystitis - cefUROXime (CEFTIN) 250 MG tablet; Take 1 tablet (250 mg total) by mouth 2 (two) times daily with a meal.  Dispense: 14 tablet; Refill: 0 - Urine Culture pending  2. Nausea - promethazine (PHENERGAN) 25 MG tablet; Take 1 tablet (25 mg total) by mouth every 6 (six) hours as needed for nausea.  Dispense: 20 tablet; Refill: 1   3. Nystagmus, congenital  4. Chronic intractable headache, unspecified headache type - reassuring neuro exam without deficits. Headaches have features of migraine and tension-type headache. Given the daily frequency of the headaches I recommend obtaining a new MRI. Reviewed CT Head and MRI Brain reports from 2013, which were negative. Will also re-start Topamax - topiramate (TOPAMAX) 50 MG tablet; One half tab by mouth nightly for a week, then one tab by mouth nightly.  Dispense: 30 tablet; Refill: 1 - MR BRAIN W WO CONTRAST  5. Anxiety - pre-procedure prophylaxis with Valium 30 minutes prior to MRI - diazepam (VALIUM) 5 MG tablet; Take 1 tab PO 1 hour before procedure or imaging.  Dispense: 2 tablet; Refill: 0  Patient education and anticipatory guidance given  Patient agrees with treatment plan Follow-up as needed if symptoms worsen or fail to improve  Darlyne Russian PA-C

## 2016-10-11 LAB — URINE CULTURE

## 2016-10-12 ENCOUNTER — Other Ambulatory Visit: Payer: Self-pay | Admitting: Physician Assistant

## 2016-10-12 DIAGNOSIS — I10 Essential (primary) hypertension: Secondary | ICD-10-CM

## 2016-10-12 NOTE — Progress Notes (Signed)
Labs required for imaging to be completed.

## 2016-10-13 ENCOUNTER — Encounter: Payer: Self-pay | Admitting: Physician Assistant

## 2016-10-14 NOTE — Progress Notes (Signed)
Urine culture showed colonization, but no acute infection Flank pain is likely musculoskeletal due to lumbar degenerative disc disease and spinal stenosis Okay to complete antibiotic

## 2016-10-22 ENCOUNTER — Telehealth: Payer: Self-pay | Admitting: Physician Assistant

## 2016-10-22 NOTE — Telephone Encounter (Signed)
Patient with chronic intractable headaches for over 1 month. MRI brain recommended. Patient declines the exam due to cost Recommend if symptoms persist that she at least proceed with a Head CT w/contrast with the understanding that it is not as sensitive as MRI and may miss some things.

## 2016-10-22 NOTE — Telephone Encounter (Signed)
-----   Message from Candi LeashAmanda M Grissom, [place] sent at 10/22/2016 10:48 AM EDT ----- .Pt informed of results. Pt expressed understanding and is agreeable. Pt stated that she can't afford to have an MRI due to costs.  Liborio NixonAmanda Grissom CMA, RT

## 2016-11-25 ENCOUNTER — Other Ambulatory Visit: Payer: Self-pay | Admitting: Physician Assistant

## 2016-12-03 ENCOUNTER — Other Ambulatory Visit: Payer: Self-pay | Admitting: Physician Assistant

## 2016-12-03 DIAGNOSIS — G8929 Other chronic pain: Secondary | ICD-10-CM

## 2016-12-03 DIAGNOSIS — R519 Other chronic pain: Secondary | ICD-10-CM

## 2016-12-03 DIAGNOSIS — R51 Headache: Principal | ICD-10-CM

## 2016-12-03 NOTE — Telephone Encounter (Signed)
Needs appt to follow up on efficacy.

## 2016-12-21 ENCOUNTER — Ambulatory Visit (INDEPENDENT_AMBULATORY_CARE_PROVIDER_SITE_OTHER): Payer: BLUE CROSS/BLUE SHIELD | Admitting: Physician Assistant

## 2016-12-21 ENCOUNTER — Encounter: Payer: Self-pay | Admitting: Physician Assistant

## 2016-12-21 VITALS — BP 150/83 | HR 81 | Ht 64.0 in | Wt 236.0 lb

## 2016-12-21 DIAGNOSIS — F430 Acute stress reaction: Secondary | ICD-10-CM | POA: Diagnosis not present

## 2016-12-21 DIAGNOSIS — G43001 Migraine without aura, not intractable, with status migrainosus: Secondary | ICD-10-CM | POA: Diagnosis not present

## 2016-12-21 DIAGNOSIS — G8929 Other chronic pain: Secondary | ICD-10-CM

## 2016-12-21 DIAGNOSIS — G894 Chronic pain syndrome: Secondary | ICD-10-CM

## 2016-12-21 DIAGNOSIS — R519 Headache, unspecified: Secondary | ICD-10-CM | POA: Insufficient documentation

## 2016-12-21 DIAGNOSIS — R51 Headache: Secondary | ICD-10-CM

## 2016-12-21 DIAGNOSIS — R079 Chest pain, unspecified: Secondary | ICD-10-CM | POA: Diagnosis not present

## 2016-12-21 DIAGNOSIS — I10 Essential (primary) hypertension: Secondary | ICD-10-CM | POA: Diagnosis not present

## 2016-12-21 DIAGNOSIS — G43009 Migraine without aura, not intractable, without status migrainosus: Secondary | ICD-10-CM | POA: Insufficient documentation

## 2016-12-21 MED ORDER — DULOXETINE HCL 30 MG PO CPEP: 30.0000 mg | ORAL_CAPSULE | Freq: Every day | ORAL | 1 refills | Status: DC

## 2016-12-21 MED ORDER — TRIAMTERENE-HCTZ 37.5-25 MG PO TABS: 1.0000 | ORAL_TABLET | Freq: Every day | ORAL | 5 refills | Status: DC

## 2016-12-21 MED ORDER — VERAPAMIL HCL ER 300 MG PO CP24: 1.0000 | ORAL_CAPSULE | Freq: Every day | ORAL | 5 refills | Status: DC

## 2016-12-21 MED ORDER — ALPRAZOLAM 0.5 MG PO TABS: 0.5000 mg | ORAL_TABLET | Freq: Two times a day (BID) | ORAL | 1 refills | Status: DC | PRN

## 2016-12-21 MED ORDER — TOPIRAMATE 50 MG PO TABS: ORAL_TABLET | ORAL | 5 refills | Status: DC

## 2016-12-21 NOTE — Progress Notes (Signed)
Subjective:    Patient ID: Sheri King, female    DOB: 24-Jun-1955, 61 y.o.   MRN: [id number]  HPI  Pt is a 61 yo female who presents to the clinic to follow up on headaches. She was started on topomax by my partner, Sheri King, for migraines. She is only taking once a day. She continues to have migraines/headaches daily. She did not get MRI due to cost. She is not having any neuro symptoms except her congential nystagmus. She has not tried massages or anything to treat the musculoskelal side of this. She is seeing a work Therapist, occupational comp provider to see what the best protcol for chronic upper back pain.   She admits to being very overwhelmed. She has a lot going on in her family. She lives with husband but is having to take care of her parents. She denies any SI/HC  .Marland Kitchen Active Ambulatory Problems    Diagnosis Date Noted  . Anxiety 02/11/2011  . Fibromyalgia 11/05/2010  . Pure hypercholesterolemia 11/05/2010  . Essential (primary) hypertension 11/05/2010  . Avitaminosis D 11/05/2010  . Right arm pain 09/25/2015  . Right shoulder pain 09/25/2015  . Fall 09/25/2015  . Facet arthropathy of spine 09/25/2015  . 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 DDD (degenerative disc disease), lumbar 09/25/2015  . Spinal stenosis of lumbar region 09/25/2015  . Chronic pain syndrome 09/25/2015  . Adhesive capsulitis 09/25/2015  . 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 Migraine headache without aura 12/21/2016  . Right-sided chest pain 12/21/2016  . Chronic intractable headache 12/21/2016  . Stress reaction 12/21/2016   Resolved Ambulatory Problems    Diagnosis Date Noted  . Aggrieved 05/29/2011  . Viral gastroenteritis 02/27/2016   Past Medical History:  Diagnosis Date  . Adhesive capsulitis 09/25/2015  . Anxiety 02/11/2011  . Avitaminosis D 11/05/2010  . Chronic headaches   . Chronic pain syndrome 09/25/2015  . 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 DDD (degenerative disc disease), lumbar 09/25/2015   . Facet arthropathy of spine 09/25/2015  . Fall 09/25/2015  . Fibromyalgia 11/05/2010  . Hypertension   . Migraine headache without aura   . Pure hypercholesterolemia 11/05/2010  . Spinal stenosis of lumbar region 09/25/2015     Review of Systems  All other systems reviewed and are negative.      Objective:   Physical Exam  Constitutional: She is oriented to person, place, and time. She appears well-developed and well-nourished.  HENT:  Head: Normocephalic and atraumatic.  Eyes:  Congential nystagmus  Cardiovascular: Normal rate, regular rhythm and normal heart sounds.   Pulmonary/Chest: Effort normal and breath sounds normal. She has no wheezes.  Neurological: She is alert and oriented to person, place, and time.  Psychiatric: She has a normal mood and affect. Her behavior is normal.          Assessment & Plan:  Marland KitchenMarland KitchenTamra was seen today for hypertension and migraine.  Diagnoses and all orders for this visit:  Stress reaction -     DULoxetine (CYMBALTA) 30 MG capsule; Take 1 capsule (30 mg total) by mouth daily. -     ALPRAZolam (XANAX) 0.5 MG tablet; Take 1 tablet (0.5 mg total) by mouth 2 (two) times daily as needed for anxiety.  Chronic intractable headache, unspecified headache type -     topiramate (TOPAMAX) 50 MG tablet; Take one tablet twice a day daily.  Migraine without aura and with status migrainosus, not intractable -     Verapamil HCl CR 300 MG CP24;  Take 1 capsule (300 mg total) by mouth daily before breakfast.  Chronic pain syndrome -     DULoxetine (CYMBALTA) 30 MG capsule; Take 1 capsule (30 mg total) by mouth daily.  Right-sided chest pain  Essential (primary) hypertension -     Verapamil HCl CR 300 MG CP24; Take 1 capsule (300 mg total) by mouth daily before breakfast. -     triamterene-hydrochlorothiazide (MAXZIDE-25) 37.5-25 MG tablet; Take 1 tablet by mouth daily.  .. Depression screen [facility] 2/9 12/21/2016 10/09/2016  Decreased Interest 3 0   Down, Depressed, Hopeless 2 0  PHQ - 2 Score 5 0  Altered sleeping 2 -  Tired, decreased energy 3 -  Change in appetite 3 -  Feeling bad or failure about yourself  1 -  Trouble concentrating 2 -  Moving slowly or fidgety/restless 0 -  Suicidal thoughts 0 -  PHQ-9 Score 16 -   Continue with work Therapist, occupational comp claim for upper back injury pain.  Increase topamax for headache prevention to twice a day.  Start cymbalta due to mood and pain. Hopefully this will help both.discussed side effects. Follow up in 6 weeks.  Xanax given discussed not to take more than 2 a day. Discussed abuse potential.   EKG NSR with no ST elevation or depression. No acute changes.  Pinpoint tenderness to chest wall . Likely musculoskelatal. Consider ice and ibuprofen.   BP elevated today. Will recheck in 6 weeks.   Marland Kitchen.Spent 30 minutes with patient and greater than 50 percent of visit spent counseling patient regarding treatment plan.

## 2017-01-04 NOTE — Addendum Note (Signed)
Addended by: Collie SiadICHARDSON, Azharia Surratt M on: 01/04/2017 10:02 AM   Modules accepted: Orders

## 2017-01-15 ENCOUNTER — Other Ambulatory Visit: Payer: Self-pay | Admitting: *Deleted

## 2017-01-15 DIAGNOSIS — F430 Acute stress reaction: Secondary | ICD-10-CM

## 2017-01-15 DIAGNOSIS — G894 Chronic pain syndrome: Secondary | ICD-10-CM

## 2017-01-15 MED ORDER — DULOXETINE HCL 30 MG PO CPEP: 30.0000 mg | ORAL_CAPSULE | Freq: Every day | ORAL | 0 refills | Status: DC

## 2017-01-18 ENCOUNTER — Ambulatory Visit: Payer: Self-pay | Admitting: Physician Assistant

## 2017-01-20 ENCOUNTER — Encounter: Payer: Self-pay | Admitting: Physician Assistant

## 2017-01-20 ENCOUNTER — Ambulatory Visit: Payer: BLUE CROSS/BLUE SHIELD | Admitting: Physician Assistant

## 2017-01-20 VITALS — BP 155/70 | HR 82 | Ht 64.0 in | Wt 239.0 lb

## 2017-01-20 DIAGNOSIS — R519 Headache, unspecified: Secondary | ICD-10-CM

## 2017-01-20 DIAGNOSIS — R51 Headache: Secondary | ICD-10-CM | POA: Diagnosis not present

## 2017-01-20 DIAGNOSIS — F430 Acute stress reaction: Secondary | ICD-10-CM

## 2017-01-20 DIAGNOSIS — G894 Chronic pain syndrome: Secondary | ICD-10-CM

## 2017-01-20 DIAGNOSIS — F339 Major depressive disorder, recurrent, unspecified: Secondary | ICD-10-CM

## 2017-01-20 DIAGNOSIS — M797 Fibromyalgia: Secondary | ICD-10-CM | POA: Diagnosis not present

## 2017-01-20 DIAGNOSIS — G43001 Migraine without aura, not intractable, with status migrainosus: Secondary | ICD-10-CM

## 2017-01-20 DIAGNOSIS — R110 Nausea: Secondary | ICD-10-CM

## 2017-01-20 DIAGNOSIS — F419 Anxiety disorder, unspecified: Secondary | ICD-10-CM

## 2017-01-20 MED ORDER — HYDROXYZINE HCL 25 MG PO TABS: 25.0000 mg | ORAL_TABLET | Freq: Three times a day (TID) | ORAL | 2 refills | Status: DC | PRN

## 2017-01-20 MED ORDER — PROMETHAZINE HCL 25 MG PO TABS: 25.0000 mg | ORAL_TABLET | Freq: Four times a day (QID) | ORAL | 1 refills | Status: DC | PRN

## 2017-01-20 MED ORDER — CYCLOBENZAPRINE HCL 10 MG PO TABS: 10.0000 mg | ORAL_TABLET | Freq: Three times a day (TID) | ORAL | 2 refills | Status: DC | PRN

## 2017-01-20 MED ORDER — DULOXETINE HCL 30 MG PO CPEP: 60.0000 mg | ORAL_CAPSULE | Freq: Every day | ORAL | 2 refills | Status: DC

## 2017-01-20 MED ORDER — BUTALBITAL-ASPIRIN-CAFFEINE 50-325-40 MG PO CAPS: 1.0000 | ORAL_CAPSULE | Freq: Four times a day (QID) | ORAL | 2 refills | Status: DC | PRN

## 2017-01-20 NOTE — Progress Notes (Signed)
Subjective:    Patient ID: Sheri King, female    DOB: [birth year], 61 y.o.   MRN: [id number]  HPI  Pt is a 61 yo female who presents to the clinic to follow up on migraine/tension headaches and addition of topamax. She has not noticed any difference. Per pt she has banded headache almost every day and some will convert to actual migraines. Headaches start behind eye and can alternate. excedrin migraine or aleve do not work. Rest helps the most. Pt is in a workmans comp case for her ongoing cervical and lumbar back pain. Injury occurred on may 4th 2016 and she has not worked since. She has seen a doctor who confirmed injuries and referral placed to someone who can help her daily pain.   Pt feels down, hopeless, agitated, anxious most of the time. She denies any SI/HC.   Marland Kitchen.. Active Ambulatory Problems    Diagnosis Date Noted  . Anxiety 02/11/2011  . Fibromyalgia 11/05/2010  . Pure hypercholesterolemia 11/05/2010  . Essential (primary) hypertension 11/05/2010  . Avitaminosis D 11/05/2010  . Right arm pain 09/25/2015  . Right shoulder pain 09/25/2015  . Fall 09/25/2015  . Facet arthropathy of spine 09/25/2015  . 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 DDD (degenerative disc disease), lumbar 09/25/2015  . Spinal stenosis of lumbar region 09/25/2015  . Chronic pain syndrome 09/25/2015  . Adhesive capsulitis 09/25/2015  . 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 Migraine headache without aura 12/21/2016  . Right-sided chest pain 12/21/2016  . Chronic intractable headache 12/21/2016  . Stress reaction 12/21/2016  . Depression, recurrent (HCC) 01/26/2017   Resolved Ambulatory Problems    Diagnosis Date Noted  . Aggrieved 05/29/2011  . Viral gastroenteritis 02/27/2016   Past Medical History:  Diagnosis Date  . Adhesive capsulitis 09/25/2015  . Anxiety 02/11/2011  . Avitaminosis D 11/05/2010  . Chronic headaches   . Chronic pain syndrome 09/25/2015  . Congenital nystagmus  05/01/2016  . DDD (degenerative disc disease), lumbar 09/25/2015  . Facet arthropathy of spine 09/25/2015  . Fall 09/25/2015  . Fibromyalgia 11/05/2010  . Hypertension   . Migraine headache without aura   . Pure hypercholesterolemia 11/05/2010  . Spinal stenosis of lumbar region 09/25/2015     Review of Systems  Constitutional: Negative.   HENT: Negative.   Respiratory: Negative.   Endocrine: Negative.        Objective:   Physical Exam  Constitutional: She is oriented to person, place, and time. She appears well-developed and well-nourished.  HENT:  Head: Normocephalic and atraumatic.  Eyes:  Congenital nystagmus  Cardiovascular: Normal rate, regular rhythm and normal heart sounds.  Pulmonary/Chest: Effort normal and breath sounds normal. She has no wheezes.  Neurological: She is alert and oriented to person, place, and time.  Psychiatric: Her behavior is normal.  Flat affect          Assessment & Plan:  Marland Kitchen.Marland Kitchen.Sheri King was seen today for depression.  Diagnoses and all orders for this visit:  Chronic pain syndrome -     DULoxetine (CYMBALTA) 30 MG capsule; Take 2 capsules (60 mg total) daily by mouth.  Nausea -     promethazine (PHENERGAN) 25 MG tablet; Take 1 tablet (25 mg total) every 6 (six) hours as needed by mouth for nausea.  Stress reaction -     DULoxetine (CYMBALTA) 30 MG capsule; Take 2 capsules (60 mg total) daily by mouth.  Migraine without aura and with status migrainosus, not intractable -  butalbital-aspirin-caffeine (FIORINAL) 50-325-40 MG capsule; Take 1 capsule every 6 (six) hours as needed by mouth for headache.  Chronic intractable headache, unspecified headache type -     cyclobenzaprine (FLEXERIL) 10 MG tablet; Take 1 tablet (10 mg total) 3 (three) times daily as needed by mouth for muscle spasms. -     butalbital-aspirin-caffeine (FIORINAL) 50-325-40 MG capsule; Take 1 capsule every 6 (six) hours as needed by mouth for headache.  Anxiety -      hydrOXYzine (ATARAX/VISTARIL) 25 MG tablet; Take 1 tablet (25 mg total) 3 (three) times daily as needed by mouth.  Fibromyalgia -     DULoxetine (CYMBALTA) 30 MG capsule; Take 2 capsules (60 mg total) daily by mouth.  Depression, recurrent (HCC) -     DULoxetine (CYMBALTA) 30 MG capsule; Take 2 capsules (60 mg total) daily by mouth.    Depression screen [facility] 2/9 01/20/2017 12/21/2016 10/09/2016  Decreased Interest 3 3 0  Down, Depressed, Hopeless 3 2 0  PHQ - 2 Score 6 5 0  Altered sleeping 3 2 -  Tired, decreased energy 3 3 -  Change in appetite 3 3 -  Feeling bad or failure about yourself  2 1 -  Trouble concentrating 3 2 -  Moving slowly or fidgety/restless 2 0 -  Suicidal thoughts 0 0 -  PHQ-9 Score 22 16 -   .Marland Kitchen. GAD 7 : Generalized Anxiety Score 01/20/2017 12/21/2016  Nervous, Anxious, on Edge 3 3  Control/stop worrying 3 3  Worry too much - different things 3 3  Trouble relaxing 3 3  Restless 3 2  Easily annoyed or irritable 3 2  Afraid - awful might happen 3 2  Total GAD 7 Score 21 18  StOP topamax it does not seem to be working. Will increase cymbalta to 60mg  daily.  Add vistaril for anxiety and nerves up to three times a day.  Add flexeril at bedtime in hopes to prevent some of the tension from building up.  Fiorinal given to use sparingly for headache rescue.   Follow up in 2 months.   We cannot evaluate or address cervical or lumbar pain due to ongoing workmans comp case.   Marland Kitchen..Spent 30 minutes with patient and greater than 50 percent of visit spent counseling patient regarding treatment plan.

## 2017-01-26 ENCOUNTER — Encounter: Payer: Self-pay | Admitting: Physician Assistant

## 2017-01-26 DIAGNOSIS — F339 Major depressive disorder, recurrent, unspecified: Secondary | ICD-10-CM | POA: Insufficient documentation

## 2017-02-11 ENCOUNTER — Other Ambulatory Visit: Payer: Self-pay | Admitting: Physician Assistant

## 2017-02-11 DIAGNOSIS — F430 Acute stress reaction: Secondary | ICD-10-CM

## 2017-02-23 ENCOUNTER — Other Ambulatory Visit: Payer: Self-pay | Admitting: *Deleted

## 2017-02-23 DIAGNOSIS — M797 Fibromyalgia: Secondary | ICD-10-CM

## 2017-02-23 DIAGNOSIS — F430 Acute stress reaction: Secondary | ICD-10-CM

## 2017-02-23 DIAGNOSIS — F339 Major depressive disorder, recurrent, unspecified: Secondary | ICD-10-CM

## 2017-02-23 DIAGNOSIS — G894 Chronic pain syndrome: Secondary | ICD-10-CM

## 2017-02-23 MED ORDER — DULOXETINE HCL 60 MG PO CPEP: 60.0000 mg | ORAL_CAPSULE | Freq: Every day | ORAL | 2 refills | Status: DC

## 2017-03-12 ENCOUNTER — Other Ambulatory Visit: Payer: Self-pay | Admitting: Orthopedic Surgery

## 2017-03-12 DIAGNOSIS — M533 Sacrococcygeal disorders, not elsewhere classified: Secondary | ICD-10-CM

## 2017-03-19 ENCOUNTER — Telehealth: Payer: Self-pay

## 2017-03-19 MED ORDER — VERAPAMIL HCL 80 MG PO TABS: 80.0000 mg | ORAL_TABLET | Freq: Three times a day (TID) | ORAL | 2 refills | Status: DC

## 2017-03-19 NOTE — Telephone Encounter (Signed)
Sent over generic verapamil three times a day. Recheck BP in 2-3 weeks.

## 2017-03-19 NOTE — Telephone Encounter (Signed)
The Verapamil HCl CR cost has gone up and she is unable to afford the medication. The Verapamil plain is still affordable.

## 2017-03-22 NOTE — Telephone Encounter (Signed)
Left message advising of recommendations.  

## 2017-03-26 ENCOUNTER — Ambulatory Visit
Admission: RE | Admit: 2017-03-26 | Discharge: 2017-03-26 | Disposition: A | Discharge status: Home / Self Care | Payer: Worker's Compensation | Source: Ambulatory Visit | Attending: Orthopedic Surgery | Admitting: Orthopedic Surgery

## 2017-03-26 DIAGNOSIS — M533 Sacrococcygeal disorders, not elsewhere classified: Secondary | ICD-10-CM

## 2017-03-26 NOTE — Discharge Instructions (Signed)

## 2017-03-30 NOTE — Telephone Encounter (Signed)
Patient advised.

## 2017-04-09 ENCOUNTER — Other Ambulatory Visit: Payer: Self-pay | Admitting: Physician Assistant

## 2017-04-09 DIAGNOSIS — F430 Acute stress reaction: Secondary | ICD-10-CM

## 2017-04-14 ENCOUNTER — Other Ambulatory Visit: Payer: Self-pay | Admitting: *Deleted

## 2017-04-14 DIAGNOSIS — F419 Anxiety disorder, unspecified: Secondary | ICD-10-CM

## 2017-04-14 MED ORDER — HYDROXYZINE HCL 25 MG PO TABS: 25.0000 mg | ORAL_TABLET | Freq: Three times a day (TID) | ORAL | 2 refills | Status: DC | PRN

## 2017-04-19 ENCOUNTER — Other Ambulatory Visit: Payer: Self-pay | Admitting: Physician Assistant

## 2017-04-19 DIAGNOSIS — G8929 Other chronic pain: Secondary | ICD-10-CM

## 2017-04-19 DIAGNOSIS — R51 Headache: Principal | ICD-10-CM

## 2017-04-19 DIAGNOSIS — R519 Other chronic pain: Secondary | ICD-10-CM

## 2017-04-23 ENCOUNTER — Ambulatory Visit: Payer: 59 | Admitting: Physician Assistant

## 2017-04-23 ENCOUNTER — Encounter: Payer: Self-pay | Admitting: Physician Assistant

## 2017-04-23 VITALS — BP 140/66 | HR 76 | Ht 64.0 in | Wt 258.0 lb

## 2017-04-23 DIAGNOSIS — I10 Essential (primary) hypertension: Secondary | ICD-10-CM | POA: Diagnosis not present

## 2017-04-23 DIAGNOSIS — F339 Major depressive disorder, recurrent, unspecified: Secondary | ICD-10-CM | POA: Diagnosis not present

## 2017-04-23 DIAGNOSIS — G894 Chronic pain syndrome: Secondary | ICD-10-CM | POA: Diagnosis not present

## 2017-04-23 MED ORDER — PHENTERMINE HCL 37.5 MG PO TABS: 37.5000 mg | ORAL_TABLET | Freq: Every day | ORAL | 0 refills | Status: DC

## 2017-04-23 MED ORDER — DULOXETINE HCL 30 MG PO CPEP: 90.0000 mg | ORAL_CAPSULE | Freq: Every day | ORAL | 2 refills | Status: DC

## 2017-04-23 NOTE — Progress Notes (Signed)
Subjective:    Patient ID: Sheri King, female    DOB: 11-08-1955, 62 y.o.   MRN: [id number]  HPI  Pt is a 62 yo morbidly obese female with lumbar DDD and spinal stenosis with chronic pain and depression who presents to the clinic for medication follow up and discuss about weight.   Pt has since been refereed to pain clinic which she feels like will help her depression. She was given tylenol 3 which has helped some with pain as well. She saw Sheri King for SI joint injection which had little to know benefit.   She was started on cymbalta at last visit more for mood than pain. It hsa helped with both. No significant pain decrease but "has taken edge off". She does feel a little happier and more hopeful. Not noticed any side effects.   She is very frustrated with her weight. She is the heaviest she has ever been. She has trouble exercising due to lumbar pain. She is trying to eat healthy. She would like help in this area.   .. Active Ambulatory Problems    Diagnosis Date Noted  . Anxiety 02/11/2011  . Fibromyalgia 11/05/2010  . Pure hypercholesterolemia 11/05/2010  . Essential (primary) hypertension 11/05/2010  . Avitaminosis D 11/05/2010  . Right arm pain 09/25/2015  . Right shoulder pain 09/25/2015  . Fall 09/25/2015  . Facet arthropathy, lumbar 09/25/2015  . 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 DDD (degenerative disc disease), lumbar 09/25/2015  . Spinal stenosis of lumbar region 09/25/2015  . Chronic pain syndrome 09/25/2015  . Adhesive capsulitis 09/25/2015  . 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 Migraine headache without aura 12/21/2016  . Right-sided chest pain 12/21/2016  . Chronic intractable headache 12/21/2016  . Stress reaction 12/21/2016  . Depression, recurrent (HCC) 01/26/2017   Resolved Ambulatory Problems    Diagnosis Date Noted  . Aggrieved 05/29/2011  . Viral gastroenteritis 02/27/2016   Past Medical History:  Diagnosis Date  . Adhesive  capsulitis 09/25/2015  . Anxiety 02/11/2011  . Avitaminosis D 11/05/2010  . Chronic headaches   . Chronic pain syndrome 09/25/2015  . 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 DDD (degenerative disc disease), lumbar 09/25/2015  . Facet arthropathy of spine 09/25/2015  . Fall 09/25/2015  . Fibromyalgia 11/05/2010  . Hypertension   . Migraine headache without aura   . Pure hypercholesterolemia 11/05/2010  . Spinal stenosis of lumbar region 09/25/2015     Review of Systems  All other systems reviewed and are negative.      Objective:   Physical Exam  Constitutional: She is oriented to person, place, and time. She appears well-developed.  Obese.   HENT:  Head: Normocephalic and atraumatic.  Neck: Normal range of motion. Neck supple. No thyromegaly present.  Cardiovascular: Normal rate, regular rhythm and normal heart sounds.  Pulmonary/Chest: Effort normal and breath sounds normal.  Neurological: She is alert and oriented to person, place, and time.  Psychiatric: She has a normal mood and affect. Her behavior is normal.          Assessment & Plan:  Marland Kitchen.Marland Kitchen.Sheri King was seen today for anxiety and depression.  Diagnoses and all orders for this visit:  Essential (primary) hypertension  Chronic pain syndrome -     DULoxetine (CYMBALTA) 30 MG capsule; Take 3 capsules (90 mg total) by mouth daily.  Depression, recurrent (HCC) -     DULoxetine (CYMBALTA) 30 MG capsule; Take 3 capsules (90 mg total) by mouth daily.  Morbid obesity (  HCC) -     phentermine (ADIPEX-P) 37.5 MG tablet; Take 1 tablet (37.5 mg total) by mouth daily before breakfast.  .. Depression screen [facility] 2/9 04/23/2017 01/20/2017 12/21/2016 10/09/2016  Decreased Interest 2 3 3  0  Down, Depressed, Hopeless 2 3 2  0  PHQ - 2 Score 4 6 5  0  Altered sleeping 2 3 2  -  Tired, decreased energy 2 3 3  -  Change in appetite 2 3 3  -  Feeling bad or failure about yourself  0 2 1 -  Trouble concentrating 2 3 2  -  Moving slowly or  fidgety/restless 0 2 0 -  Suicidal thoughts 0 0 0 -  PHQ-9 Score 12 22 16  -    BP still borderline hopefully weight loss will help with this.   Discussed weight loss options. She has high deductible plan. Phentermine is only affordable options. Discussed side effects. Pt denies any palpitations or arrthymia hx. Follow up in 1 month.  Marland Kitchen.Discussed low carb diet with 1500 calories and 80g of protein.  Exercising at least 150 minutes a week.  [employer] could be a Chief Technology Officer.   Increased cymbalta to continue to help with mood and pain. Follow up in 1 month.   Marland Kitchen.Spent 30 minutes with patient and greater than 50 percent of visit spent counseling patient regarding treatment plan.

## 2017-04-25 ENCOUNTER — Encounter: Payer: Self-pay | Admitting: Physician Assistant

## 2017-04-25 DIAGNOSIS — E6609 Other obesity due to excess calories: Secondary | ICD-10-CM | POA: Insufficient documentation

## 2017-04-25 DIAGNOSIS — Z6839 Body mass index (BMI) 39.0-39.9, adult: Secondary | ICD-10-CM

## 2017-05-21 ENCOUNTER — Ambulatory Visit: Payer: Self-pay | Admitting: Physician Assistant

## 2017-06-12 ENCOUNTER — Other Ambulatory Visit: Payer: Self-pay | Admitting: Physician Assistant

## 2017-06-12 DIAGNOSIS — F430 Acute stress reaction: Secondary | ICD-10-CM

## 2017-07-01 ENCOUNTER — Encounter: Payer: Self-pay | Admitting: Physician Assistant

## 2017-07-01 ENCOUNTER — Other Ambulatory Visit: Payer: Self-pay | Admitting: Physician Assistant

## 2017-07-01 ENCOUNTER — Ambulatory Visit (INDEPENDENT_AMBULATORY_CARE_PROVIDER_SITE_OTHER): Payer: 59 | Admitting: Physician Assistant

## 2017-07-01 VITALS — BP 132/84 | HR 86 | Temp 98.6°F | Wt 248.8 lb

## 2017-07-01 DIAGNOSIS — F430 Acute stress reaction: Secondary | ICD-10-CM

## 2017-07-01 DIAGNOSIS — J00 Acute nasopharyngitis [common cold]: Secondary | ICD-10-CM

## 2017-07-01 LAB — POCT RAPID STREP A (OFFICE): Rapid Strep A Screen: NEGATIVE

## 2017-07-01 MED ORDER — FEXOFENADINE HCL 180 MG PO TABS: 180.0000 mg | ORAL_TABLET | Freq: Every day | ORAL | 3 refills | Status: DC

## 2017-07-01 MED ORDER — ALPRAZOLAM 0.5 MG PO TABS: ORAL_TABLET | ORAL | 2 refills | Status: DC

## 2017-07-01 MED ORDER — IPRATROPIUM BROMIDE 0.06 % NA SOLN: 2.0000 | Freq: Four times a day (QID) | NASAL | 0 refills | Status: DC | PRN

## 2017-07-01 NOTE — Progress Notes (Signed)
Refill appropriate. Sent xanax.

## 2017-07-01 NOTE — Patient Instructions (Addendum)
For nasal congestion/runny nose: - Atrovent nasal spray up to four times daily  - Antihistamine such as generic Allegra or Zyrtec  For sore throat: - Tylenol 1000mg  every 8 hours as needed for throat pain. Alternate with Ibuprofen 600mg  every 6 hours - Cepacol throat lozenges - Warm salt water gargles  Pharyngitis Pharyngitis is a sore throat (pharynx). There is redness, pain, and swelling of your throat. Follow these instructions at home:  Drink enough fluids to keep your pee (urine) clear or pale yellow.  Only take medicine as told by your doctor. ? You may get sick again if you do not take medicine as told. Finish your medicines, even if you start to feel better. ? Do not take aspirin.  Rest.  Rinse your mouth (gargle) with salt water ( tsp of salt per 1 qt of water) every 1-2 hours. This will help the pain.  If you are not at risk for choking, you can suck on hard candy or sore throat lozenges. Contact a doctor if:  You have large, tender lumps on your neck.  You have a rash.  You cough up green, yellow-brown, or bloody spit. Get help right away if:  You have a stiff neck.  You drool or cannot swallow liquids.  You throw up (vomit) or are not able to keep medicine or liquids down.  You have very bad pain that does not go away with medicine.  You have problems breathing (not from a stuffy nose). This information is not intended to replace advice given to you by your health care provider. Make sure you discuss any questions you have with your health care provider. Document Released: 08/19/2007 Document Revised: 08/08/2015 Document Reviewed: 11/07/2012 Elsevier Interactive Patient Education  2017 [employer]

## 2017-07-01 NOTE — Progress Notes (Signed)
HPI:                                                                Sheri King is a 62 y.o. female who presents to [facility]: Primary Care Sports Medicine today for sore throat  Sore Throat   This is a new problem. The current episode started yesterday. The problem has been unchanged. Neither side of throat is experiencing more pain than the other. There has been no fever. The pain is moderate. Associated symptoms include ear pain (right). Pertinent negatives include no coughing, hoarse voice, stridor or vomiting. Associated symptoms comments: + rhinorrhea. She has had no exposure to strep. She has tried nothing for the symptoms.    Depression screen [facility] 2/9 04/23/2017 01/20/2017 12/21/2016 10/09/2016  Decreased Interest 2 3 3  0  Down, Depressed, Hopeless 2 3 2  0  PHQ - 2 Score 4 6 5  0  Altered sleeping 2 3 2  -  Tired, decreased energy 2 3 3  -  Change in appetite 2 3 3  -  Feeling bad or failure about yourself  0 2 1 -  Trouble concentrating 2 3 2  -  Moving slowly or fidgety/restless 0 2 0 -  Suicidal thoughts 0 0 0 -  PHQ-9 Score 12 22 16  -    GAD 7 : Generalized Anxiety Score 04/23/2017 01/20/2017 12/21/2016  Nervous, Anxious, on Edge 2 3 3   Control/stop worrying 2 3 3   Worry too much - different things 2 3 3   Trouble relaxing 2 3 3   Restless 1 3 2   Easily annoyed or irritable 1 3 2   Afraid - awful might happen 1 3 2   Total GAD 7 Score 11 21 18       Past Medical History:  Diagnosis Date  . Adhesive capsulitis 09/25/2015  . Anxiety 02/11/2011  . Avitaminosis D 11/05/2010  . Chronic headaches   . Chronic pain syndrome 09/25/2015   Dr. Marca Ancona at [facility].    . 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 DDD (degenerative disc disease), lumbar 09/25/2015  . Facet arthropathy of spine 09/25/2015  . Fall 09/25/2015  . Fibromyalgia 11/05/2010  . Hypertension   . Migraine headache without aura   . Pure hypercholesterolemia 11/05/2010  . Spinal stenosis of lumbar region  09/25/2015   Past Surgical History:  Procedure Laterality Date  . ENDOMETRIAL ABLATION    . TONSILLECTOMY AND ADENOIDECTOMY     at age 86   Social History   Tobacco Use  . Smoking status: Never Smoker  . Smokeless tobacco: Never Used  Substance Use Topics  . Alcohol use: No    Alcohol/week: 0.0 oz   family history includes Diabetes in her mother.    ROS: negative except as noted in the HPI  Medications: Current Outpatient Medications  Medication Sig Dispense Refill  . acetaminophen-codeine (TYLENOL #3) 300-30 MG tablet TAKE 1-2 TABLETS BY MOUTH EVERY 4 TO 6 HOURS AS NEEDED FOR PAIN  0  . ALPRAZolam (XANAX) 0.5 MG tablet TAKE 1 TABLET BY MOUTH TWICE A DAY AS NEEDED FOR ANXIETY 60 tablet 1  . butalbital-aspirin-caffeine (FIORINAL) 50-325-40 MG capsule Take 1 capsule every 6 (six) hours as needed by mouth for headache. 14 capsule 2  . cyclobenzaprine (FLEXERIL) 10 MG tablet TAKE 1  TABLET BY MOUTH THREE TIMES A DAY AS NEEDED FOR MUSCLE SPASM 30 tablet 2  . diphenhydrAMINE (BENADRYL) 25 MG tablet Take 25 mg by mouth daily.    . DULoxetine (CYMBALTA) 30 MG capsule Take 3 capsules (90 mg total) by mouth daily. 90 capsule 2  . fexofenadine (ALLEGRA) 180 MG tablet Take 1 tablet (180 mg total) by mouth daily. 90 tablet 3  . hydrOXYzine (ATARAX/VISTARIL) 25 MG tablet Take 1 tablet (25 mg total) by mouth 3 (three) times daily as needed. 90 tablet 2  . ipratropium (ATROVENT) 0.06 % nasal spray Place 2 sprays into both nostrils 4 (four) times daily as needed. 15 mL 0  . meloxicam (MOBIC) 15 MG tablet TAKE 1 TABLET BY MOUTH EVERY MORNING AS NEEDED. TAKE WITH FOOD.  1  . methocarbamol (ROBAXIN) 500 MG tablet TAKE 1 TABLET BY MOUTH EVERY 6-8 HRS AS NEEDED SPASMS/PAIN  0  . phentermine (ADIPEX-P) 37.5 MG tablet Take 1 tablet (37.5 mg total) by mouth daily before breakfast. 30 tablet 0  . promethazine (PHENERGAN) 25 MG tablet Take 1 tablet (25 mg total) every 6 (six) hours as needed by mouth for  nausea. 20 tablet 1  . triamterene-hydrochlorothiazide (MAXZIDE-25) 37.5-25 MG tablet Take 1 tablet by mouth daily. 30 tablet 5  . verapamil (CALAN) 80 MG tablet Take 1 tablet (80 mg total) by mouth 3 (three) times daily. 90 tablet 2   No current facility-administered medications for this visit.    Allergies  Allergen Reactions  . Latex Itching, Swelling and Other (See Comments)    wheezing  . Tramadol Other (See Comments)    Per Dr. Lissa MoralesNnadi- increase risk of seizures  . Codeine Itching and Rash  . Nexium [Esomeprazole Magnesium] Other (See Comments)    cramp  . Nsaids Nausea Only    Gi upset       Objective:  BP 132/84   Pulse 86   Temp 98.6 F (37 C)   Wt 248 lb 12.8 oz (112.9 kg)   BMI 42.71 kg/m  Gen:  alert, ill-appearing, not toxic-appearing, no distress, appropriate for age, obese female HEENT: head normocephalic without obvious abnormality, conjunctiva and cornea clear, TM's pearly gray and semi-transparent bilaterally, nasal mucosa edematous with clear rhionrrhea, oropharynx with erythema, no edema or exudates, tonsils absent, no cervical adenopathy, trachea midline Pulm: Normal work of breathing, normal phonation, clear to auscultation bilaterally, no wheezes, rales or rhonchi CV: Normal rate, regular rhythm, s1 and s2 distinct, no murmurs, clicks or rubs  Neuro: alert and oriented x 3, no tremor MSK: extremities atraumatic, normal gait and station Skin: warm and diaphoretic, no rashes on exposed skin, no jaundice, no cyanosis     No results found for this or any previous visit (from the past 72 hour(s)). No results found.    Assessment and Plan: 62 y.o. female with   Rhinopharyngitis - Plan: fexofenadine (ALLEGRA) 180 MG tablet, ipratropium (ATROVENT) 0.06 % nasal spray, POCT rapid strep A - POC strep A negative, Centor score 0 - symptomatic care (see patient instructions) - declines Tramadol and Meloxicam  Patient education and anticipatory guidance  given Patient agrees with treatment plan Follow-up as needed if symptoms worsen or fail to improve  Levonne Hubertharley E. Roshonda Sperl PA-C

## 2017-07-11 ENCOUNTER — Other Ambulatory Visit: Payer: Self-pay | Admitting: Physician Assistant

## 2017-08-01 ENCOUNTER — Other Ambulatory Visit: Payer: Self-pay | Admitting: Physician Assistant

## 2017-08-01 DIAGNOSIS — I10 Essential (primary) hypertension: Secondary | ICD-10-CM

## 2017-08-02 ENCOUNTER — Other Ambulatory Visit: Payer: Self-pay | Admitting: Physician Assistant

## 2017-08-02 DIAGNOSIS — F339 Major depressive disorder, recurrent, unspecified: Secondary | ICD-10-CM

## 2017-08-02 DIAGNOSIS — G894 Chronic pain syndrome: Secondary | ICD-10-CM

## 2017-08-03 ENCOUNTER — Other Ambulatory Visit: Payer: Self-pay | Admitting: Physician Assistant

## 2017-08-03 DIAGNOSIS — I10 Essential (primary) hypertension: Secondary | ICD-10-CM

## 2017-08-08 ENCOUNTER — Other Ambulatory Visit: Payer: Self-pay | Admitting: Physician Assistant

## 2017-08-08 DIAGNOSIS — R51 Headache: Principal | ICD-10-CM

## 2017-08-08 DIAGNOSIS — R519 Headache, unspecified: Secondary | ICD-10-CM

## 2017-08-11 ENCOUNTER — Other Ambulatory Visit: Payer: Self-pay | Admitting: Physician Assistant

## 2017-08-11 DIAGNOSIS — F419 Anxiety disorder, unspecified: Secondary | ICD-10-CM

## 2017-08-13 ENCOUNTER — Other Ambulatory Visit: Payer: Self-pay | Admitting: Physician Assistant

## 2017-08-13 DIAGNOSIS — F339 Major depressive disorder, recurrent, unspecified: Secondary | ICD-10-CM

## 2017-08-13 DIAGNOSIS — G894 Chronic pain syndrome: Secondary | ICD-10-CM

## 2017-08-26 ENCOUNTER — Other Ambulatory Visit: Payer: Self-pay | Admitting: Physician Assistant

## 2017-08-26 DIAGNOSIS — I10 Essential (primary) hypertension: Secondary | ICD-10-CM

## 2017-08-27 ENCOUNTER — Ambulatory Visit (INDEPENDENT_AMBULATORY_CARE_PROVIDER_SITE_OTHER): Payer: 59 | Admitting: Physician Assistant

## 2017-08-27 ENCOUNTER — Encounter: Payer: Self-pay | Admitting: Physician Assistant

## 2017-08-27 VITALS — BP 106/66 | HR 86 | Temp 98.1°F | Wt 252.6 lb

## 2017-08-27 DIAGNOSIS — R21 Rash and other nonspecific skin eruption: Secondary | ICD-10-CM | POA: Diagnosis not present

## 2017-08-27 DIAGNOSIS — L280 Lichen simplex chronicus: Secondary | ICD-10-CM

## 2017-08-27 MED ORDER — METHYLPREDNISOLONE 4 MG PO TBPK: ORAL_TABLET | ORAL | 0 refills | Status: DC

## 2017-08-27 NOTE — Patient Instructions (Addendum)
- apply a moisturizing unscented cream (Eucerin, Cetaphil etc.) every 3 hours to prevent dryness, which will worsen itching - follow instructions for steroid taper - take Hydroxyzine at bedtime as needed for itching  Contact Dermatitis Dermatitis is redness, soreness, and swelling (inflammation) of the skin. Contact dermatitis is a reaction to certain substances that touch the skin. There are two types of contact dermatitis:  Irritant contact dermatitis. This type is caused by something that irritates your skin, such as dry hands from washing them too much. This type does not require previous exposure to the substance for a reaction to occur. This type is more common.  Allergic contact dermatitis. This type is caused by a substance that you are allergic to, such as a nickel allergy or poison ivy. This type only occurs if you have been exposed to the substance (allergen) before. Upon a repeat exposure, your body reacts to the substance. This type is less common.  What are the causes? Many different substances can cause contact dermatitis. Irritant contact dermatitis is most commonly caused by exposure to:  Makeup.  Soaps.  Detergents.  Bleaches.  Acids.  Metal salts, such as nickel.  Allergic contact dermatitis is most commonly caused by exposure to:  Poisonous plants.  Chemicals.  Jewelry.  Latex.  Medicines.  Preservatives in products, such as clothing.  What increases the risk? This condition is more likely to develop in:  People who have jobs that expose them to irritants or allergens.  People who have certain medical conditions, such as asthma or eczema.  What are the signs or symptoms? Symptoms of this condition may occur anywhere on your body where the irritant has touched you or is touched by you. Symptoms include:  Dryness or flaking.  Redness.  Cracks.  Itching.  Pain or a burning feeling.  Blisters.  Drainage of small amounts of blood or clear  fluid from skin cracks.  With allergic contact dermatitis, there may also be swelling in areas such as the eyelids, mouth, or genitals. How is this diagnosed? This condition is diagnosed with a medical history and physical exam. A patch skin test may be performed to help determine the cause. If the condition is related to your job, you may need to see an occupational medicine specialist. How is this treated? Treatment for this condition includes figuring out what caused the reaction and protecting your skin from further contact. Treatment may also include:  Steroid creams or ointments. Oral steroid medicines may be needed in more severe cases.  Antibiotics or antibacterial ointments, if a skin infection is present.  Antihistamine lotion or an antihistamine taken by mouth to ease itching.  A bandage (dressing).  Follow these instructions at home: Skin Care  Moisturize your skin as needed.  Apply cool compresses to the affected areas.  Try taking a bath with: ? Epsom salts. Follow the instructions on the packaging. You can get these at your local pharmacy or grocery store. ? Baking soda. Pour a small amount into the bath as directed by your health care provider. ? Colloidal oatmeal. Follow the instructions on the packaging. You can get this at your local pharmacy or grocery store.  Try applying baking soda paste to your skin. Stir water into baking soda until it reaches a paste-like consistency.  Do not scratch your skin.  Bathe less frequently, such as every other day.  Bathe in lukewarm water. Avoid using hot water. Medicines  Take or apply over-the-counter and prescription medicines only as told  by your health care provider.  If you were prescribed an antibiotic medicine, take or apply your antibiotic as told by your health care provider. Do not stop using the antibiotic even if your condition starts to improve. General instructions  Keep all follow-up visits as told by  your health care provider. This is important.  Avoid the substance that caused your reaction. If you do not know what caused it, keep a journal to try to track what caused it. Write down: ? What you eat. ? What cosmetic products you use. ? What you drink. ? What you wear in the affected area. This includes jewelry.  If you were given a dressing, take care of it as told by your health care provider. This includes when to change and remove it. Contact a health care provider if:  Your condition does not improve with treatment.  Your condition gets worse.  You have signs of infection such as swelling, tenderness, redness, soreness, or warmth in the affected area.  You have a fever.  You have new symptoms. Get help right away if:  You have a severe headache, neck pain, or neck stiffness.  You vomit.  You feel very sleepy.  You notice red streaks coming from the affected area.  Your bone or joint underneath the affected area becomes painful after the skin has healed.  The affected area turns darker.  You have difficulty breathing. This information is not intended to replace advice given to you by your health care provider. Make sure you discuss any questions you have with your health care provider. Document Released: 02/28/2000 Document Revised: 08/08/2015 Document Reviewed: 07/18/2014 Elsevier Interactive Patient Education  2018 [employer].

## 2017-08-27 NOTE — Progress Notes (Signed)
HPI:                                                                Sheri King is a 62 y.o. female who presents to [facility]: Primary Care Sports Medicine today for rash  Onset: 4 days ago Location: started on left lower back, has spread to shoulder, upper back, buttock and hip Duration: constant Character: pruritic, burning Aggravating factors / Triggers: nothing Treatments tried: Cortisone-10, temporary relief  Recent illness / systemic symptoms: none  Medication / drug exposure: none Recent travel: no Animal/insect exposure: cat (indoor) History of allergies: Latex Exposure to new soaps, perfumes, cleaning products: none Exposure to chemicals: none       Past Medical History:  Diagnosis Date  . Adhesive capsulitis 09/25/2015  . Anxiety 02/11/2011  . Avitaminosis D 11/05/2010  . Chronic headaches   . Chronic pain syndrome 09/25/2015   Dr. Marca Ancona at [facility].    . 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 DDD (degenerative disc disease), lumbar 09/25/2015  . Facet arthropathy of spine 09/25/2015  . Fall 09/25/2015  . Fibromyalgia 11/05/2010  . Hypertension   . Migraine headache without aura   . Pure hypercholesterolemia 11/05/2010  . Spinal stenosis of lumbar region 09/25/2015   Past Surgical History:  Procedure Laterality Date  . ENDOMETRIAL ABLATION    . TONSILLECTOMY AND ADENOIDECTOMY     at age 38   Social History   Tobacco Use  . Smoking status: Never Smoker  . Smokeless tobacco: Never Used  Substance Use Topics  . Alcohol use: No    Alcohol/week: 0.0 oz   family history includes Diabetes in her mother.    ROS: negative except as noted in the HPI  Medications: Current Outpatient Medications  Medication Sig Dispense Refill  . ALPRAZolam (XANAX) 0.5 MG tablet TAKE 1 TABLET BY MOUTH TWICE A DAY AS NEEDED FOR ANXIETY 60 tablet 2  . cyclobenzaprine (FLEXERIL) 10 MG tablet Take 1 tablet (10 mg total) by mouth 3 (three) times daily as needed  for muscle spasms. Due for OV with PCP 30 tablet 0  . DULoxetine (CYMBALTA) 30 MG capsule TAKE 3 CAPSULES BY MOUTH ONCE DAILY 90 capsule 2  . hydrOXYzine (ATARAX/VISTARIL) 25 MG tablet Take 1 tablet (25 mg total) by mouth 3 (three) times daily as needed. Must schedule OV for further refills 90 tablet 0  . ipratropium (ATROVENT) 0.06 % nasal spray Place 2 sprays into both nostrils 4 (four) times daily as needed. 15 mL 0  . methocarbamol (ROBAXIN) 500 MG tablet TAKE 1 TABLET BY MOUTH EVERY 6-8 HRS AS NEEDED SPASMS/PAIN  0  . morphine (MS CONTIN) 15 MG 12 hr tablet Take 15 mg by mouth every 12 (twelve) hours.    Marland Kitchen morphine (MSIR) 15 MG tablet Take 15 mg by mouth every 12 (twelve) hours as needed for severe pain.    . promethazine (PHENERGAN) 25 MG tablet Take 1 tablet (25 mg total) every 6 (six) hours as needed by mouth for nausea. 20 tablet 1  . triamterene-hydrochlorothiazide (MAXZIDE-25) 37.5-25 MG tablet Take 1 tablet by mouth daily. Due for follow up 30 tablet 0  . verapamil (CALAN) 80 MG tablet Take 1 tablet (80 mg total) by mouth 3 (three) times daily. Must  make appointment before any further refills given 45 tablet 0  . methylPREDNISolone (MEDROL DOSEPAK) 4 MG TBPK tablet Follow PO taper as prescribed 21 tablet 0   No current facility-administered medications for this visit.    Allergies  Allergen Reactions  . Latex Itching, Swelling and Other (See Comments)    wheezing  . Tramadol Other (See Comments)    Per Dr. Lissa MoralesNnadi- increase risk of seizures  . Codeine Itching and Rash  . Nexium [Esomeprazole Magnesium] Other (See Comments)    cramp  . Nsaids Nausea Only    Gi upset       Objective:  BP 106/66   Pulse 86   Temp 98.1 F (36.7 C) (Oral)   Wt 252 lb 9.6 oz (114.6 kg)   BMI 43.36 kg/m  Gen:  alert, not ill-appearing, no distress, appropriate for age, obese female HEENT: head normocephalic without obvious abnormality, conjunctiva and cornea clear, trachea midline Pulm:  Normal work of breathing, normal phonation murmurs, clicks or rubs  Neuro: alert and oriented x 3, no tremor MSK: extremities atraumatic, normal gait and station Skin: scattered ulcerated papules, isolated and linear distributions on bilateral trunk and buttock     No results found for this or any previous visit (from the past 72 hour(s)). No results found.    Assessment and Plan: 62 y.o. female with   Rash and nonspecific skin eruption - Plan: methylPREDNISolone (MEDROL DOSEPAK) 4 MG TBPK tablet  Neurodermatitis  Disseminated non-dermatomal rash with appearance of neurodermatitis. Encouraged moisturizing unscented cream Q3H and avoid itching. Hydroxyzine prn for itching. Will treat with Medrol dosepak since rash is diffuse and would be difficult to cover with topical steroid     Patient education and anticipatory guidance given Patient agrees with treatment plan Follow-up as needed if symptoms worsen or fail to improve  Levonne Hubertharley E. Minela Bridgewater PA-C

## 2017-08-31 ENCOUNTER — Encounter: Payer: Self-pay | Admitting: Physician Assistant

## 2017-09-25 ENCOUNTER — Other Ambulatory Visit: Payer: Self-pay | Admitting: Physician Assistant

## 2017-09-25 DIAGNOSIS — F419 Anxiety disorder, unspecified: Secondary | ICD-10-CM

## 2017-09-29 ENCOUNTER — Ambulatory Visit: Payer: 59 | Admitting: Physician Assistant

## 2017-09-29 ENCOUNTER — Encounter: Payer: Self-pay | Admitting: Physician Assistant

## 2017-09-29 VITALS — BP 128/73 | HR 81 | Wt 249.0 lb

## 2017-09-29 DIAGNOSIS — M152 Bouchard's nodes (with arthropathy): Secondary | ICD-10-CM

## 2017-09-29 DIAGNOSIS — I10 Essential (primary) hypertension: Secondary | ICD-10-CM | POA: Diagnosis not present

## 2017-09-29 DIAGNOSIS — F430 Acute stress reaction: Secondary | ICD-10-CM

## 2017-09-29 DIAGNOSIS — Z13220 Encounter for screening for lipoid disorders: Secondary | ICD-10-CM

## 2017-09-29 DIAGNOSIS — G894 Chronic pain syndrome: Secondary | ICD-10-CM | POA: Diagnosis not present

## 2017-09-29 DIAGNOSIS — Z131 Encounter for screening for diabetes mellitus: Secondary | ICD-10-CM

## 2017-09-29 DIAGNOSIS — F419 Anxiety disorder, unspecified: Secondary | ICD-10-CM

## 2017-09-29 MED ORDER — TRIAMTERENE-HCTZ 37.5-25 MG PO TABS: 1.0000 | ORAL_TABLET | Freq: Every day | ORAL | 11 refills | Status: DC

## 2017-09-29 MED ORDER — METHOCARBAMOL 500 MG PO TABS: ORAL_TABLET | ORAL | 5 refills | Status: DC

## 2017-09-29 MED ORDER — VERAPAMIL HCL 80 MG PO TABS: 80.0000 mg | ORAL_TABLET | Freq: Three times a day (TID) | ORAL | 11 refills | Status: DC

## 2017-09-29 MED ORDER — HYDROXYZINE HCL 25 MG PO TABS
25.0000 mg | ORAL_TABLET | Freq: Three times a day (TID) | ORAL | 5 refills | Status: DC | PRN
Start: 2017-09-29 — End: 2017-12-17

## 2017-09-29 MED ORDER — ALPRAZOLAM 0.5 MG PO TABS: ORAL_TABLET | ORAL | 5 refills | Status: DC

## 2017-09-29 NOTE — Progress Notes (Signed)
Subjective:    Patient ID: Sheri King, female    DOB: 07-28-1955, 62 y.o.   MRN: [id number]  HPI  Pt is a 62 yo female with lumbar DDD, facet arthropathy, chronic pain who presents to the clinic for refills.   Chronic back pain- pt is finally seeing a pain clinic doctor and on morphine. she has been on for 3 months and her life has changed for the better. She continues to have pain but manageable. request to switch to robaxin from flexeril. She stopped cymbalta and has not noticed in difference in mood.   Pt has painful arthritis in both hands. She has nodules on her DIP of both hands index and middle finger. She cannot take NSAIDs. She reports voltaren not working. She has tried tumeric with no relief.   .. Active Ambulatory Problems    Diagnosis Date Noted  . Anxiety 02/11/2011  . Fibromyalgia 11/05/2010  . Pure hypercholesterolemia 11/05/2010  . Essential (primary) hypertension 11/05/2010  . Avitaminosis D 11/05/2010  . Right arm pain 09/25/2015  . Right shoulder pain 09/25/2015  . Fall 09/25/2015  . Facet arthropathy, lumbar 09/25/2015  . 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 DDD (degenerative disc disease), lumbar 09/25/2015  . Spinal stenosis of lumbar region 09/25/2015  . Chronic pain syndrome 09/25/2015  . Adhesive capsulitis 09/25/2015  . 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 Migraine headache without aura 12/21/2016  . Right-sided chest pain 12/21/2016  . Chronic intractable headache 12/21/2016  . Stress reaction 12/21/2016  . Depression, recurrent (HCC) 01/26/2017  . Morbid obesity (HCC) 04/25/2017  . 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 Aggrieved 05/29/2011  . Viral gastroenteritis 02/27/2016   Past Medical History:  Diagnosis Date  . Adhesive capsulitis 09/25/2015  . Anxiety 02/11/2011  . Avitaminosis D 11/05/2010  . Chronic headaches   . Chronic pain syndrome  09/25/2015  . 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 DDD (degenerative disc disease), lumbar 09/25/2015  . Facet arthropathy of spine 09/25/2015  . Fall 09/25/2015  . Fibromyalgia 11/05/2010  . Hypertension   . Migraine headache without aura   . Pure hypercholesterolemia 11/05/2010  . Spinal stenosis of lumbar region 09/25/2015      Review of Systems See HPI.     Objective:   Physical Exam  Constitutional: She is oriented to person, place, and time. She appears well-developed and well-nourished.  HENT:  Head: Normocephalic and atraumatic.  Cardiovascular: Normal rate and regular rhythm.  Pulmonary/Chest: Effort normal and breath sounds normal.  Musculoskeletal:  DIP of bilateral hands with harden nodules and index finger on the right inverted at joint. Tender to palpation.   Neurological: She is alert and oriented to person, place, and time.  Congential nystagmus.   Psychiatric: She has a normal mood and affect. Her behavior is normal.          Assessment & Plan:  Marland Kitchen.Marland Kitchen.Sheri King was seen today for hypertension.  Diagnoses and all orders for this visit:  Bouchard's nodes  Essential (primary) hypertension -     verapamil (CALAN) 80 MG tablet; Take 1 tablet (80 mg total) by mouth 3 (three) times daily. -     triamterene-hydrochlorothiazide (MAXZIDE-25) 37.5-25 MG tablet; Take 1 tablet by mouth daily.  Chronic pain syndrome -     methocarbamol (ROBAXIN) 500 MG tablet; TAKE 1 TABLET BY MOUTH EVERY 6-8 HRS AS NEEDED SPASMS/PAIN  Stress reaction -  ALPRAZolam (XANAX) 0.5 MG tablet; TAKE 1 TABLET BY MOUTH TWICE A DAY AS NEEDED FOR ANXIETY  Anxiety -     hydrOXYzine (ATARAX/VISTARIL) 25 MG tablet; Take 1 tablet (25 mg total) by mouth 3 (three) times daily as needed.  Screening for diabetes mellitus -     COMPLETE METABOLIC PANEL WITH GFR  Screening for lipid disorders -     Lipid Panel w/reflex Direct LDL   Took cymbalta off list. Certainly watch for any worsening depression  anxiety pain.  Continue to follow up with pain clinic.  Switched flexeril to robaxin.  Xanax refilled but discussed abuse potential and the risk of sudden death in combination with pain rx.  Vistaril also refilled for itching.  Fasting labs ordered.  Hands are consisent with OA. Discussed sports medicine for injections. She declines topical NSAId. Cannot tolerate oral NSAIDs. Tumeric ineffective. Consider glucosamine chondrotin.   BP looks great. Refills given.   Marland Kitchen.Spent 30 minutes with patient and greater than 50 percent of visit spent counseling patient regarding treatment plan.

## 2017-09-30 DIAGNOSIS — M152 Bouchard's nodes (with arthropathy): Secondary | ICD-10-CM | POA: Insufficient documentation

## 2017-12-08 LAB — CBC AND DIFFERENTIAL
HCT: 38 (ref 36–46)
Hemoglobin: 13 (ref 12.0–16.0)
Platelets: 287 (ref 150–399)
WBC: 8.9

## 2017-12-08 LAB — BASIC METABOLIC PANEL
BUN: 8 (ref 4–21)
Creatinine: 0.9 (ref 0.5–1.1)
Glucose: 94
POTASSIUM: 3.4 (ref 3.4–5.3)
Sodium: 138 (ref 137–147)

## 2017-12-08 LAB — HEPATIC FUNCTION PANEL
ALT: 39 — AB (ref 7–35)
AST: 62 — AB (ref 13–35)
Alkaline Phosphatase: 90 (ref 25–125)
Bilirubin, Total: 0.5

## 2017-12-17 ENCOUNTER — Other Ambulatory Visit: Payer: Self-pay | Admitting: Physician Assistant

## 2017-12-17 DIAGNOSIS — F419 Anxiety disorder, unspecified: Secondary | ICD-10-CM

## 2018-01-10 ENCOUNTER — Ambulatory Visit (INDEPENDENT_AMBULATORY_CARE_PROVIDER_SITE_OTHER): Payer: 59 | Admitting: Physician Assistant

## 2018-01-10 ENCOUNTER — Encounter: Payer: Self-pay | Admitting: Physician Assistant

## 2018-01-10 VITALS — BP 134/79 | HR 76 | Ht 64.0 in | Wt 228.0 lb

## 2018-01-10 DIAGNOSIS — Z13220 Encounter for screening for lipoid disorders: Secondary | ICD-10-CM

## 2018-01-10 DIAGNOSIS — Z131 Encounter for screening for diabetes mellitus: Secondary | ICD-10-CM | POA: Diagnosis not present

## 2018-01-10 DIAGNOSIS — E6609 Other obesity due to excess calories: Secondary | ICD-10-CM

## 2018-01-10 DIAGNOSIS — Z6839 Body mass index (BMI) 39.0-39.9, adult: Secondary | ICD-10-CM

## 2018-01-10 DIAGNOSIS — R748 Abnormal levels of other serum enzymes: Secondary | ICD-10-CM | POA: Diagnosis not present

## 2018-01-10 DIAGNOSIS — L299 Pruritus, unspecified: Secondary | ICD-10-CM | POA: Insufficient documentation

## 2018-01-10 MED ORDER — HYDROXYZINE HCL 50 MG PO TABS: 50.0000 mg | ORAL_TABLET | Freq: Three times a day (TID) | ORAL | 3 refills | Status: DC | PRN

## 2018-01-10 NOTE — Patient Instructions (Signed)
Nonalcoholic Fatty Liver Disease Diet Nonalcoholic fatty liver disease is a condition that causes fat to accumulate in and around the liver. The disease makes it harder for the liver to work the way that it should. Following a healthy diet can help to keep nonalcoholic fatty liver disease under control. It can also help to prevent or improve conditions that are associated with the disease, such as heart disease, diabetes, high blood pressure, and abnormal cholesterol levels. Along with regular exercise, this diet:  Promotes weight loss.  Helps to control blood sugar levels.  Helps to improve the way that the body uses insulin.  What do I need to know about this diet?  Use the glycemic index (GI) to plan your meals. The index tells you how quickly a food will raise your blood sugar. Choose low-GI foods. These foods take a longer time to raise blood sugar.  Keep track of how many calories you take in. Eating the right amount of calories will help you to achieve a healthy weight.  You may want to follow a Mediterranean diet. This diet includes a lot of vegetables, lean meats or fish, whole grains, fruits, and healthy oils and fats. What foods can I eat? Grains Whole grains, such as whole-wheat or whole-grain breads, crackers, tortillas, cereals, and pasta. Stone-ground whole wheat. Pumpernickel bread. Unsweetened oatmeal. Bulgur. Barley. Quinoa. Brown or wild rice. Corn or whole-wheat flour tortillas. Vegetables Lettuce. Spinach. Peas. Beets. Cauliflower. Cabbage. Broccoli. Carrots. Tomatoes. Squash. Eggplant. Herbs. Peppers. Onions. Cucumbers. Brussels sprouts. Yams and sweet potatoes. Beans. Lentils. Fruits Bananas. Apples. Oranges. Grapes. Papaya. Mango. Pomegranate. Kiwi. Grapefruit. Cherries. Meats and Other Protein Sources Seafood and shellfish. Lean meats. Poultry. Tofu. Dairy Low-fat or fat-free dairy products, such as yogurt, cottage cheese, and cheese. Beverages Water. Sugar-free  drinks. Tea. Coffee. Low-fat or skim milk. Milk alternatives, such as soy or almond milk. Real fruit juice. Condiments Mustard. Relish. Low-fat, low-sugar ketchup and barbecue sauce. Low-fat or fat-free mayonnaise. Sweets and Desserts Sugar-free sweets. Fats and Oils Avocado. Canola or olive oil. Nuts and nut butters. Seeds. The items listed above may not be a complete list of recommended foods or beverages. Contact your dietitian for more options. What foods are not recommended? Palm oil and coconut oil. Processed foods. Fried foods. Sweetened drinks, such as sweet tea, milkshakes, snow cones, iced sweet drinks, and sodas. Alcohol. Sweets. Foods that contain a lot of salt or sodium. The items listed above may not be a complete list of foods and beverages to avoid. Contact your dietitian for more information. This information is not intended to replace advice given to you by your health care provider. Make sure you discuss any questions you have with your health care provider. Document Released: 07/17/2014 Document Revised: 08/08/2015 Document Reviewed: 03/27/2014 Elsevier Interactive Patient Education  2018 [employer]  

## 2018-01-10 NOTE — Progress Notes (Signed)
Subjective:    Patient ID: Sheri King, female    DOB: Sep 16, 1955, 62 y.o.   MRN: [id number]  HPI  Patient is a 62 year old obese female with history of hypertension, migraines, chronic pain due to lumbar generative disc disease and spinal stenosis who presents to the clinic to be evaluated for elevated liver enzymes.  She had her blood work done by the pain clinic and her AST was 62 and her ALT was 39 as well as her alk phos was 90.  She was told by pain clinic to follow-up with primary care.  Lab work was done approximately 1 month ago.  She denies any alcohol consumption.  She has taken a lot of Tylenol over the past 3 years.  She no longer takes any Tylenol but is prescribed morphine by pain clinic.  In the past she has declined hep C testing.  She denies any illegal drug use.  She continues to mention itching since she is started pain medicine.  She was told by pain clinic to use Benadryl and Claritin.  This does not help.  I gave her hydroxyzine 25 mg up to 3 times a day but she says this does not completely resolve itching.  She wonders if there is anything else to try.    Pt is very concerned she is in liver failure.   .. Active Ambulatory Problems    Diagnosis Date Noted  . Anxiety 02/11/2011  . Fibromyalgia 11/05/2010  . Pure hypercholesterolemia 11/05/2010  . Essential (primary) hypertension 11/05/2010  . Avitaminosis D 11/05/2010  . Right arm pain 09/25/2015  . Right shoulder pain 09/25/2015  . Fall 09/25/2015  . Facet arthropathy, lumbar 09/25/2015  . 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 DDD (degenerative disc disease), lumbar 09/25/2015  . Spinal stenosis of lumbar region 09/25/2015  . Chronic pain syndrome 09/25/2015  . Adhesive capsulitis 09/25/2015  . 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 Migraine headache without aura 12/21/2016  . Right-sided chest pain 12/21/2016  . Chronic intractable headache 12/21/2016  . Stress reaction 12/21/2016  . Depression,  recurrent ([place]) 01/26/2017  . Class 2 obesity due to excess calories without serious comorbidity with body mass index (BMI) of 39.0 to 39.9 in adult 04/25/2017  . 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 Elevated liver enzymes 01/10/2018  . Pruritus 01/10/2018   Resolved Ambulatory Problems    Diagnosis Date Noted  . Aggrieved 05/29/2011  . Viral gastroenteritis 02/27/2016   Past Medical History:  Diagnosis Date  . Chronic headaches   . Facet arthropathy of spine 09/25/2015  . Hypertension       Review of Systems See HPI.     Objective:   Physical Exam  Constitutional: She is oriented to person, place, and time. She appears well-developed and well-nourished.  Obese.   Eyes: Conjunctivae are normal.  No signs of jaundice.  Cardiovascular: Normal rate and regular rhythm.  Pulmonary/Chest: Effort normal.  Abdominal: Soft. She exhibits no distension. There is no tenderness. There is no rebound and no guarding.  Neurological: She is alert and oriented to person, place, and time.  Skin: No rash noted.  She does have excoriations and picking lesions on extremities.   Psychiatric: She has a normal mood and affect. Her behavior is normal.          Assessment & Plan:  Marland KitchenMarland KitchenDiagnoses and all orders for this visit:  Elevated liver enzymes -     Hepatic function panel -  Hepatitis panel, acute -     [place] ELASTOGRAPHY LIVER -     Hemoglobin A1c -     Lipid Panel w/reflex Direct LDL -     Albumin -     Fe+TIBC+Fer  Screening for lipid disorders -     Lipid Panel w/reflex Direct LDL  Screening for diabetes mellitus -     Hemoglobin A1c  Class 2 obesity due to excess calories without serious comorbidity with body mass index (BMI) of 39.0 to 39.9 in adult  Pruritus -     hydrOXYzine (ATARAX/VISTARIL) 50 MG tablet; Take 1 tablet (50 mg total) by mouth 3 (three) times daily as needed. For itching.   Patient brings in copies of labs.  Reassured her that  liver enzymes are only slightly elevated.  I certainly recommend [place] going ahead with full work-up however likely this is fatty liver disease and/or has something to do with her chronic pain medication.  We will certainly keep monitoring this.  We will recheck liver enzymes today and add hepatitis panel, lipid panel, A1c, ferritin, iron.  This will give [place] better information about the causes of elevated liver enzymes.  Certainly I do not think that the level to stop pain medication.  Patient does not drink any alcohol and strongly encouraged to avoid this.  Do not take any other Tylenol products as well either.  Wynetta Emery tomography ultrasound was also ordered today.  Patient requests this being done in [place] or [place]  The itching sounds like it is a side effect pain medication.  She should bring this up with her pain clinic provider.  Increase hydroxyzine to 50 mg to see if that helps with itching and does not cause any extreme sedation.  The timeline suggest that itching and pain medicine started at the same time.  Certainly itching can be a sign of liver disease however I do not think her labs are suggestive of that at this time.    Marland Kitchen.Spent 30 minutes with patient and greater than 50 percent of visit spent counseling patient regarding treatment plan.

## 2018-01-11 ENCOUNTER — Encounter: Payer: Self-pay | Admitting: Physician Assistant

## 2018-01-11 DIAGNOSIS — E785 Hyperlipidemia, unspecified: Secondary | ICD-10-CM | POA: Insufficient documentation

## 2018-01-11 DIAGNOSIS — R7303 Prediabetes: Secondary | ICD-10-CM | POA: Insufficient documentation

## 2018-01-11 LAB — LIPID PANEL W/REFLEX DIRECT LDL
Cholesterol: 257 mg/dL — ABNORMAL HIGH (ref <200)
HDL: 40 mg/dL — AB (ref >50)
LDL Cholesterol (Calc): 185 mg/dL (calc) — ABNORMAL HIGH
Non-HDL Cholesterol (Calc): 217 mg/dL (calc) — ABNORMAL HIGH (ref <130)
Total CHOL/HDL Ratio: 6.4 (calc) — ABNORMAL HIGH (ref <5.0)
Triglycerides: 170 mg/dL — ABNORMAL HIGH (ref <150)

## 2018-01-11 LAB — IRON,TIBC AND FERRITIN PANEL
%SAT: 38 % (calc) (ref 16–45)
Ferritin: 227 ng/mL (ref 16–232)
IRON: 104 ug/dL (ref 45–160)
TIBC: 275 mcg/dL (calc) (ref 250–450)

## 2018-01-11 LAB — HEPATIC FUNCTION PANEL
AG Ratio: 1.5 (calc) (ref 1.0–2.5)
ALKALINE PHOSPHATASE (APISO): 102 U/L (ref 33–130)
ALT: 30 U/L — AB (ref 6–29)
AST: 51 U/L — AB (ref 10–35)
Albumin: 4.1 g/dL (ref 3.6–5.1)
BILIRUBIN TOTAL: 0.6 mg/dL (ref 0.2–1.2)
Bilirubin, Direct: 0.2 mg/dL (ref 0.0–0.2)
Globulin: 2.7 g/dL (calc) (ref 1.9–3.7)
Indirect Bilirubin: 0.4 mg/dL (calc) (ref 0.2–1.2)
TOTAL PROTEIN: 6.8 g/dL (ref 6.1–8.1)

## 2018-01-11 LAB — HEPATITIS PANEL, ACUTE
HEP C AB: NONREACTIVE
Hep A IgM: NONREACTIVE
Hep B C IgM: NONREACTIVE
Hepatitis B Surface Ag: NONREACTIVE
SIGNAL TO CUT-OFF: 0.06 (ref <1.00)

## 2018-01-11 LAB — HEMOGLOBIN A1C
EAG (MMOL/L): 7.6 (calc)
Hgb A1c MFr Bld: 6.4 % of total Hgb — ABNORMAL HIGH (ref <5.7)
MEAN PLASMA GLUCOSE: 137 (calc)

## 2018-01-11 NOTE — Progress Notes (Signed)
Call pt: recheck liver enzymes and very good news liver enzymes have any improved some. Still waiting some some labs to result and result of ultrasound.   Your cholesterol  is really elevated and your glucose is elevated(you are pre-diabetic) this really increases your risk of cardiovascular events. I would like to talk to you about or start metformin to lower sugars and lipitor to lower cholesterol. What are your thoughts?   Certainly limiting high fats in food, processed foods, fried foods, sugar and a lot of carbs can help with this as well.

## 2018-01-14 MED ORDER — ATORVASTATIN CALCIUM 40 MG PO TABS: 40.0000 mg | ORAL_TABLET | Freq: Every day | ORAL | 11 refills | Status: DC

## 2018-01-14 NOTE — Progress Notes (Signed)
Call pt: will send over lipitor 40mg  to take daily. Use good rx should be really affordable. Less than 10 dollars a month.   What are your thoughts about metformin to help with sugars? It is affordable as well.   Liver u/s results- great news no sign of fibrosis/cirrhosis.   Work on weight loss and good healthy diet.

## 2018-01-14 NOTE — Addendum Note (Signed)
Addended by: Jomarie Longs on: 01/14/2018 04:27 PM   Modules accepted: Orders

## 2018-01-31 ENCOUNTER — Telehealth: Payer: Self-pay | Admitting: Physician Assistant

## 2018-01-31 ENCOUNTER — Other Ambulatory Visit: Payer: Self-pay

## 2018-01-31 MED ORDER — ATORVASTATIN CALCIUM 40 MG PO TABS: 40.0000 mg | ORAL_TABLET | Freq: Every day | ORAL | 11 refills | Status: AC

## 2018-01-31 NOTE — Telephone Encounter (Signed)
Must not have been printed and [facility]. Printed. Left message advising patient.

## 2018-01-31 NOTE — Telephone Encounter (Addendum)
Pt called. She says that she's been trying for two weeks  to get a returned call on liver ultrasound and has not gotten a call back. Thanks  I'm sorry for the mix up  Sheri King, pt was needing her results from  ultrasound, she is not needing a referral. Sheri King has given her the results.  Thanks!

## 2018-02-01 ENCOUNTER — Other Ambulatory Visit: Payer: Self-pay | Admitting: Physician Assistant

## 2018-02-01 DIAGNOSIS — L299 Pruritus, unspecified: Secondary | ICD-10-CM

## 2018-03-24 ENCOUNTER — Encounter: Payer: Self-pay | Admitting: Physician Assistant

## 2018-03-31 ENCOUNTER — Other Ambulatory Visit: Payer: Self-pay | Admitting: Physician Assistant

## 2018-03-31 DIAGNOSIS — F430 Acute stress reaction: Secondary | ICD-10-CM

## 2018-04-01 NOTE — Telephone Encounter (Signed)
Will give 30 tablets.

## 2018-04-13 ENCOUNTER — Other Ambulatory Visit: Payer: Self-pay | Admitting: Physician Assistant

## 2018-04-13 DIAGNOSIS — G894 Chronic pain syndrome: Secondary | ICD-10-CM

## 2018-04-30 ENCOUNTER — Other Ambulatory Visit: Payer: Self-pay | Admitting: Physician Assistant

## 2018-04-30 DIAGNOSIS — L299 Pruritus, unspecified: Secondary | ICD-10-CM

## 2018-06-12 ENCOUNTER — Other Ambulatory Visit: Payer: Self-pay | Admitting: Physician Assistant

## 2018-06-12 DIAGNOSIS — F430 Acute stress reaction: Secondary | ICD-10-CM

## 2018-06-12 DIAGNOSIS — G894 Chronic pain syndrome: Secondary | ICD-10-CM

## 2018-06-13 NOTE — Telephone Encounter (Signed)
Requesting Methocarbamol:  Last RF sent 04/13/18 for #120 with 1 RF   Requesting Xanax:   Last RF sent 04/01/18 for #30 with no RF  Lat appt 01/10/18, no upcoming appt   RX pended, please review

## 2018-07-12 ENCOUNTER — Ambulatory Visit (INDEPENDENT_AMBULATORY_CARE_PROVIDER_SITE_OTHER): Payer: 59 | Admitting: Physician Assistant

## 2018-07-12 VITALS — BP 123/70 | HR 80 | Wt 223.0 lb

## 2018-07-12 DIAGNOSIS — F430 Acute stress reaction: Secondary | ICD-10-CM

## 2018-07-12 DIAGNOSIS — I10 Essential (primary) hypertension: Secondary | ICD-10-CM

## 2018-07-12 DIAGNOSIS — G894 Chronic pain syndrome: Secondary | ICD-10-CM | POA: Diagnosis not present

## 2018-07-12 DIAGNOSIS — F339 Major depressive disorder, recurrent, unspecified: Secondary | ICD-10-CM

## 2018-07-12 DIAGNOSIS — M48061 Spinal stenosis, lumbar region without neurogenic claudication: Secondary | ICD-10-CM

## 2018-07-12 DIAGNOSIS — F419 Anxiety disorder, unspecified: Secondary | ICD-10-CM | POA: Diagnosis not present

## 2018-07-12 MED ORDER — METHOCARBAMOL 500 MG PO TABS: ORAL_TABLET | ORAL | 5 refills | Status: DC

## 2018-07-12 MED ORDER — ALPRAZOLAM 0.5 MG PO TABS: ORAL_TABLET | ORAL | 5 refills | Status: DC

## 2018-07-12 NOTE — Progress Notes (Signed)
-  Needs refills- pended  -PHQ/GAD done

## 2018-07-13 ENCOUNTER — Encounter: Payer: Self-pay | Admitting: Physician Assistant

## 2018-07-13 NOTE — Progress Notes (Signed)
Patient ID: Sheri King, female   DOB: 08-Jul-1955, 63 y.o.   MRN: [id number] .Marland Kitchen.Virtual Visit via Telephone Note  I connected with Sheri King on 07/13/18 at  9:50 AM EDT by telephone and verified that I am speaking with the correct person using two identifiers.   I discussed the limitations, risks, security and privacy concerns of performing an evaluation and management service by telephone and the availability of in person appointments. I also discussed with the patient that there may be a patient responsible charge related to this service. The patient expressed understanding and agreed to proceed.   History of Present Illness: Pt is a 63 yo female with chronic pain, spinal stenosis, MDD, anxiety who calls in for refills.          Pain clinic manages her pain. She feels like pain medication is working but she is having some side effects of headaches on medication.   Overall anxiety and depression are good. No SI/HC. She does not want to start on daily anxiety and depression medication. She takes xanax bid. her pain clinic is aware and ok with dosing.    .. Active Ambulatory Problems    Diagnosis Date Noted  . Anxiety 02/11/2011  . Fibromyalgia 11/05/2010  . Pure hypercholesterolemia 11/05/2010  . Essential (primary) hypertension 11/05/2010  . Avitaminosis D 11/05/2010  . Right arm pain 09/25/2015  . Right shoulder pain 09/25/2015  . Fall 09/25/2015  . Facet arthropathy, lumbar 09/25/2015  . 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 DDD (degenerative disc disease), lumbar 09/25/2015  . Spinal stenosis of lumbar region 09/25/2015  . Chronic pain syndrome 09/25/2015  . Adhesive capsulitis 09/25/2015  . 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 Migraine headache without aura 12/21/2016  . Right-sided chest pain 12/21/2016  . Chronic intractable headache 12/21/2016  . Stress reaction 12/21/2016  . Depression, recurrent (HCC) 01/26/2017  . Class 2 obesity due to excess calories  without serious comorbidity with body mass index (BMI) of 39.0 to 39.9 in adult 04/25/2017  . 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 Elevated liver enzymes 01/10/2018  . Pruritus 01/10/2018  . Dyslipidemia (high LDL; low HDL) 01/11/2018  . Pre-diabetes 01/11/2018   Resolved Ambulatory Problems    Diagnosis Date Noted  . Aggrieved 05/29/2011  . Viral gastroenteritis 02/27/2016   Past Medical History:  Diagnosis Date  . Chronic headaches   . Facet arthropathy of spine 09/25/2015  . Hypertension    Reviewed med, allergy, problem list.      Observations/Objective: No acute distress.  No labored breathing.  Normal mood.   .. Today's Vitals   07/12/18 0942  BP: 123/70  Pulse: 80  Weight: 223 lb (101.2 kg)   Body mass index is 38.28 kg/m.  .. Depression screen [facility] 2/9 07/12/2018 01/10/2018 04/23/2017 01/20/2017 12/21/2016  Decreased Interest 1 2 2 3 3   Down, Depressed, Hopeless 0 2 2 3 2   PHQ - 2 Score 1 4 4 6 5   Altered sleeping 2 2 2 3 2   Tired, decreased energy 2 2 2 3 3   Change in appetite 1 2 2 3 3   Feeling bad or failure about yourself  0 1 0 2 1  Trouble concentrating 0 2 2 3 2   Moving slowly or fidgety/restless 0 0 0 2 0  Suicidal thoughts 0 0 0 0 0  PHQ-9 Score 6 13 12 22 16   Difficult doing work/chores Not difficult at all - - - -   .Marland Kitchen. GAD  7 : Generalized Anxiety Score 07/12/2018 01/10/2018 04/23/2017 01/20/2017  Nervous, Anxious, on Edge 0 2 2 3   Control/stop worrying 0 2 2 3   Worry too much - different things 0 2 2 3   Trouble relaxing 0 2 2 3   Restless 0 1 1 3   Easily annoyed or irritable 0 1 1 3   Afraid - awful might happen 0 1 1 3   Total GAD 7 Score 0 11 11 21   Anxiety Difficulty Not difficult at all - - -      Assessment and Plan: Marland KitchenMarland KitchenTamra was seen today for medication management.  Diagnoses and all orders for this visit:  Anxiety -     ALPRAZolam (XANAX) 0.5 MG tablet; Take twice daily as needed for anxiety.  Stress  reaction -     ALPRAZolam (XANAX) 0.5 MG tablet; Take twice daily as needed for anxiety.  Chronic pain syndrome -     methocarbamol (ROBAXIN) 500 MG tablet; TAKE 1 TABLET BY MOUTH EVERY 6-8 HOURS AS NEEDED FOR MUSCLE SPASM/PAIN.  Depression, recurrent (HCC)  Essential (primary) hypertension  Spinal stenosis of lumbar region without neurogenic claudication   Pt is stable. Refills given today.   She is going to pain clinic and ok with her on xanax.  Marland Kitchen.PDMP reviewed during this encounter. Discussed risk and to not take opioid and xanax together.   Follow Up Instructions:    I discussed the assessment and treatment plan with the patient. The patient was provided an opportunity to ask questions and all were answered. The patient agreed with the plan and demonstrated an understanding of the instructions.   The patient was advised to call back or seek an in-person evaluation if the symptoms worsen or if the condition fails to improve as anticipated.  I provided 25 minutes of non-face-to-face time during this encounter.   Tandy Gaw, PA-C

## 2018-09-01 ENCOUNTER — Other Ambulatory Visit: Payer: Self-pay | Admitting: Physician Assistant

## 2018-09-01 DIAGNOSIS — L299 Pruritus, unspecified: Secondary | ICD-10-CM

## 2018-09-29 ENCOUNTER — Other Ambulatory Visit: Payer: Self-pay | Admitting: Physician Assistant

## 2018-09-29 DIAGNOSIS — L299 Pruritus, unspecified: Secondary | ICD-10-CM

## 2018-10-03 ENCOUNTER — Ambulatory Visit (INDEPENDENT_AMBULATORY_CARE_PROVIDER_SITE_OTHER): Payer: Medicare Other | Admitting: Physician Assistant

## 2018-10-03 ENCOUNTER — Encounter: Payer: Self-pay | Admitting: Physician Assistant

## 2018-10-03 VITALS — BP 142/81 | HR 66 | Ht 64.0 in | Wt 229.0 lb

## 2018-10-03 DIAGNOSIS — G43001 Migraine without aura, not intractable, with status migrainosus: Secondary | ICD-10-CM | POA: Diagnosis not present

## 2018-10-03 DIAGNOSIS — G894 Chronic pain syndrome: Secondary | ICD-10-CM

## 2018-10-03 DIAGNOSIS — I10 Essential (primary) hypertension: Secondary | ICD-10-CM

## 2018-10-03 DIAGNOSIS — R110 Nausea: Secondary | ICD-10-CM | POA: Diagnosis not present

## 2018-10-03 MED ORDER — LIDOCAINE 5 % EX OINT: 1.0000 "application " | TOPICAL_OINTMENT | Freq: Two times a day (BID) | CUTANEOUS | 5 refills | Status: DC | PRN

## 2018-10-03 MED ORDER — PROMETHAZINE HCL 25 MG PO TABS: 25.0000 mg | ORAL_TABLET | Freq: Four times a day (QID) | ORAL | 1 refills | Status: AC | PRN

## 2018-10-03 MED ORDER — VERAPAMIL HCL 80 MG PO TABS: 80.0000 mg | ORAL_TABLET | Freq: Three times a day (TID) | ORAL | 11 refills | Status: AC

## 2018-10-03 MED ORDER — TRIAMTERENE-HCTZ 37.5-25 MG PO TABS: 1.0000 | ORAL_TABLET | Freq: Every day | ORAL | 11 refills | Status: AC

## 2018-10-03 NOTE — Progress Notes (Signed)
Patient ID: Sheri King, female   DOB: September 27, 1955, 63 y.o.   MRN: [id number] .Marland KitchenVirtual Visit via Telephone Note  I connected with Sheri King on 10/04/18 at 11:10 AM EDT by telephone and verified that I am speaking with the correct person using two identifiers.  Location: Patient: home Provider: home   I discussed the limitations, risks, security and privacy concerns of performing an evaluation and management service by telephone and the availability of in person appointments. I also discussed with the patient that there may be a patient responsible charge related to this service. The patient expressed understanding and agreed to proceed.   History of Present Illness: Pt is a 63 yo female with pmhx of migraines, MDD, chronic pain back pain who calls in to the clinic to follow up on ED visit on 7/15. She had a 3 day headache on the left side that created nausea and vomiting. She tried motrin and oxycodone she had but did not help. She was given IV fluids, compazine, decardron, benadryl, and valium. She was discharged with fioricet. She has about 2 migraines a month but most are relieved with rest and motrin. She has a dull headache now but no nausea or vomiting.   She did alert pain clinic and they changed her pain regimen to morphine.   She request lidocaine ointment she had once before for her right knee pain.   .. Active Ambulatory Problems    Diagnosis Date Noted  . Anxiety 02/11/2011  . Fibromyalgia 11/05/2010  . Pure hypercholesterolemia 11/05/2010  . Essential (primary) hypertension 11/05/2010  . Avitaminosis D 11/05/2010  . Right arm pain 09/25/2015  . Right shoulder pain 09/25/2015  . Fall 09/25/2015  . Facet arthropathy, lumbar 09/25/2015  . 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 DDD (degenerative disc disease), lumbar 09/25/2015  . Spinal stenosis of lumbar region 09/25/2015  . Chronic pain syndrome 09/25/2015  . Adhesive capsulitis 09/25/2015  . 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 Migraine headache without aura 12/21/2016  . Right-sided chest pain 12/21/2016  . Chronic intractable headache 12/21/2016  . Stress reaction 12/21/2016  . Depression, recurrent ([place]) 01/26/2017  . Class 2 obesity due to excess calories without serious comorbidity with body mass index (BMI) of 39.0 to 39.9 in adult 04/25/2017  . 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 Elevated liver enzymes 01/10/2018  . Pruritus 01/10/2018  . Dyslipidemia (high LDL; low HDL) 01/11/2018  . Pre-diabetes 01/11/2018   Resolved Ambulatory Problems    Diagnosis Date Noted  . Aggrieved 05/29/2011  . Viral gastroenteritis 02/27/2016   Past Medical History:  Diagnosis Date  . Chronic headaches   . Facet arthropathy of spine 09/25/2015  . Hypertension    Reviewed med, allergy, problem list.     Observations/Objective: No acute distress. Normal mood.  Normal breathing.   .. Today's Vitals   10/03/18 1021  BP: (!) 142/81  Pulse: 66  Weight: 229 lb (103.9 kg)  Height: 5\' 4"  (1.626 m)   Body mass index is 39.31 kg/m.    Assessment and Plan: Marland KitchenMarland KitchenTamra was seen today for migraine.  Diagnoses and all orders for this visit:  Migraine without aura and with status migrainosus, not intractable  Essential (primary) hypertension -     verapamil (CALAN) 80 MG tablet; Take 1 tablet (80 mg total) by mouth 3 (three) times daily. -     triamterene-hydrochlorothiazide (MAXZIDE-25) 37.5-25 MG tablet; Take 1 tablet by mouth daily.  Nausea -  promethazine (PHENERGAN) 25 MG tablet; Take 1 tablet (25 mg total) by mouth every 6 (six) hours as needed for nausea.  Chronic pain syndrome -     lidocaine (XYLOCAINE) 5 % ointment; Apply 1 application topically 2 (two) times daily as needed. 2 hours before skin procedure.   Pt is averaging 2 migraines a month which is usually not enough to start preventative. She was given fioricet in ED lets see how that does for  rescue. She is aware NOT to use with regular pain medications. Phenergan for nausea assoicated with headaches. Look for trigger so that you can prevent trigger.   Sent lidocaine for pain use.   BP to goal for age under 150/90. Refilled BP medications.   Follow up as needed in 6 months.   Follow Up Instructions:    I discussed the assessment and treatment plan with the patient. The patient was provided an opportunity to ask questions and all were answered. The patient agreed with the plan and demonstrated an understanding of the instructions.   The patient was advised to call back or seek an in-person evaluation if the symptoms worsen or if the condition fails to improve as anticipated.  I provided 20 minutes of non-face-to-face time during this encounter.   Tandy GawJade , PA-C

## 2018-10-03 NOTE — Progress Notes (Deleted)
Migraine/back pain follow up from hospital. Vomiting x 1.5 day with dehydration. Better now.   Right knee pain - wanted refill on old lidocaine ointment given in 2018.  Will get BP and give to provider at visit.

## 2018-10-10 IMAGING — CT CT BIOPSY
2 of 3 series · 13 of 32 positions shown, 19 images · non-contrast
Comparison: none

CLINICAL DATA: Suspected LEFT SI joint dysfunction.

[Series 4: needle -guided injection · axial · 0.84mm/px · z∈[-147,-99]mm · 9 of 31 slices shown, 15 images (1 of 2)]
[im 4/31  soft-tissue]
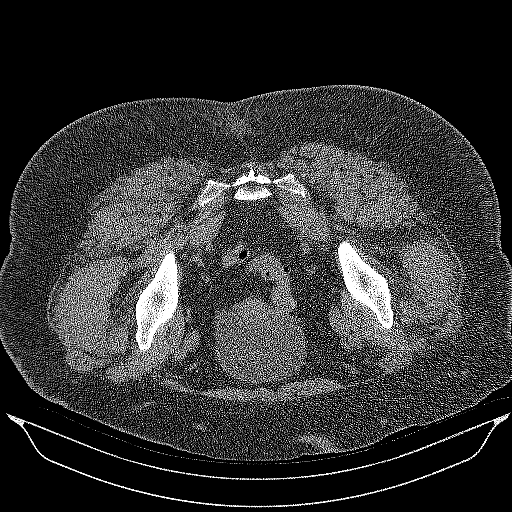
[im 4/31  bone]
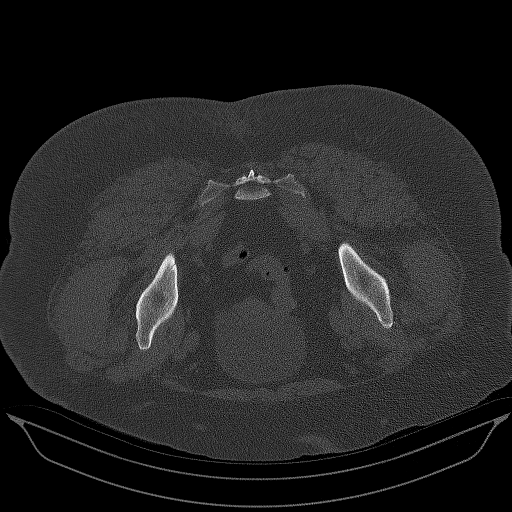
[im 7/31  soft-tissue]
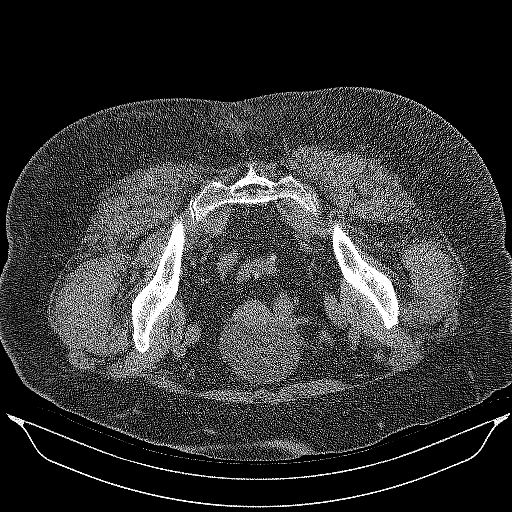
[im 10/31  soft-tissue]
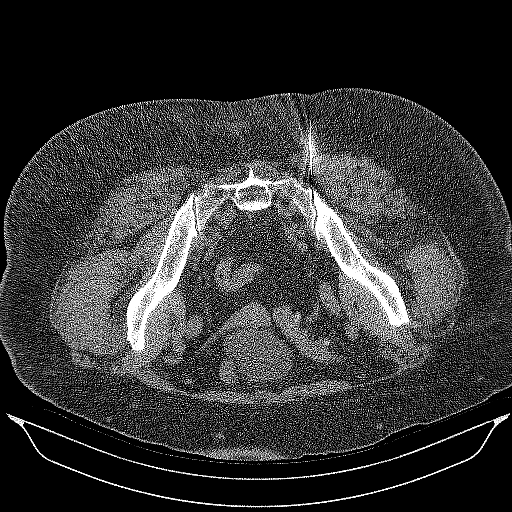
[im 13/31  soft-tissue]
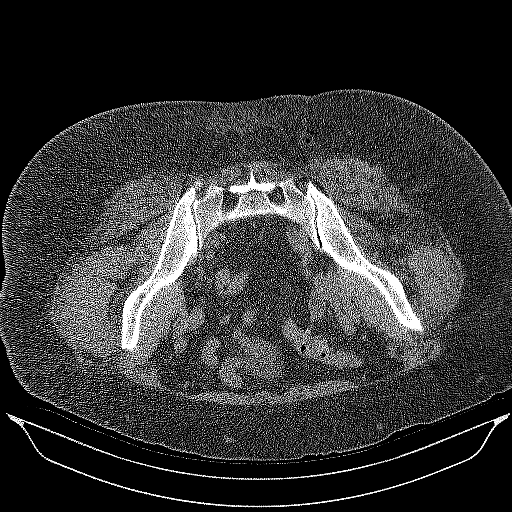
[im 16/31  soft-tissue]
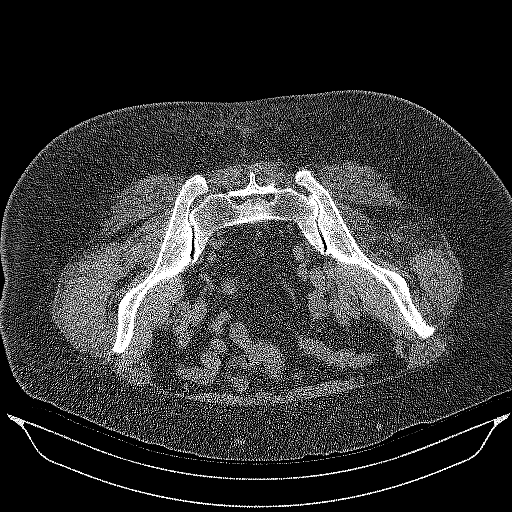
[im 19/31  soft-tissue]
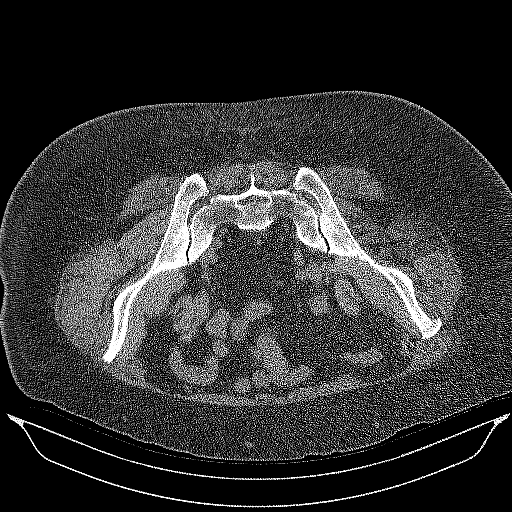
[im 19/31  lung]
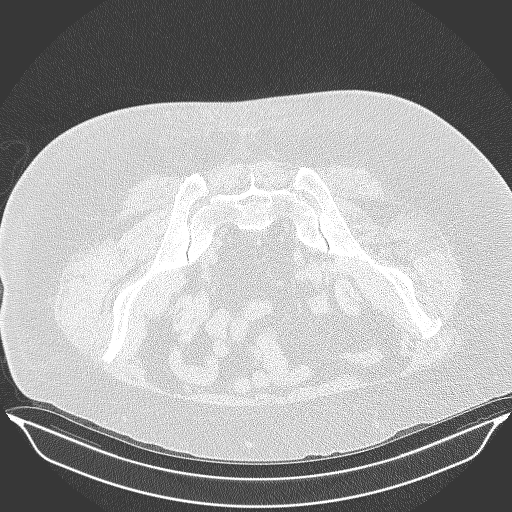
[im 22/31  soft-tissue]
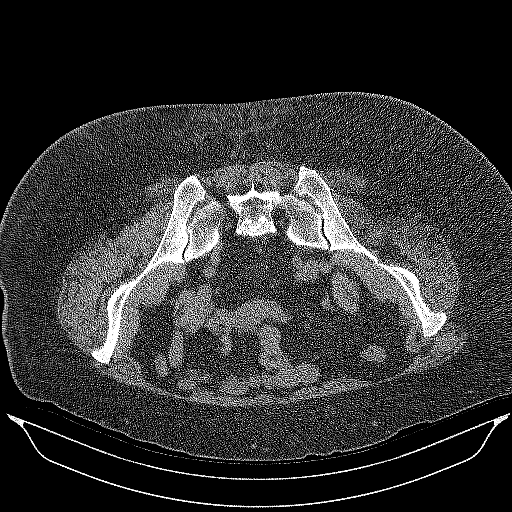
[im 22/31  lung]
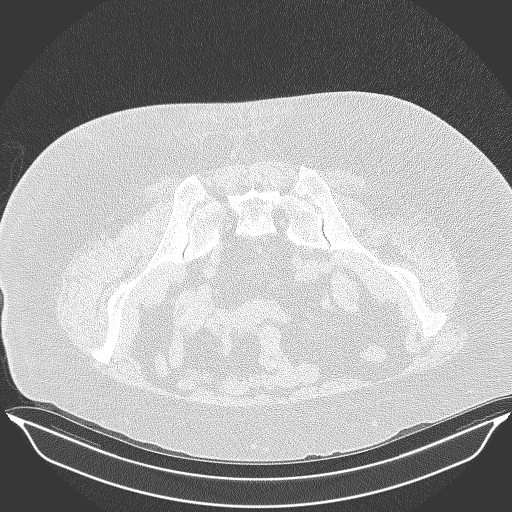
[im 25/31  soft-tissue]
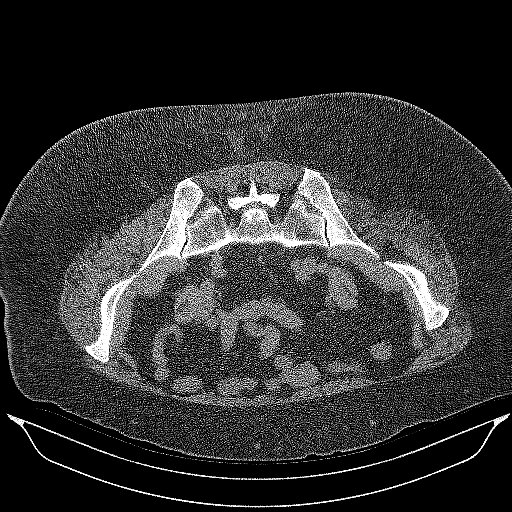
[im 25/31  lung]
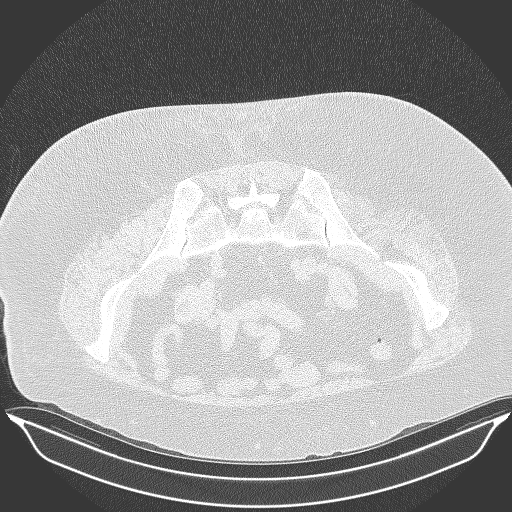
[im 28/31  soft-tissue]
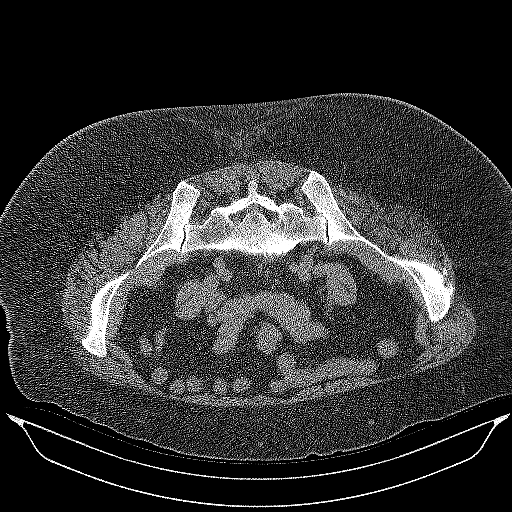
[im 28/31  lung]
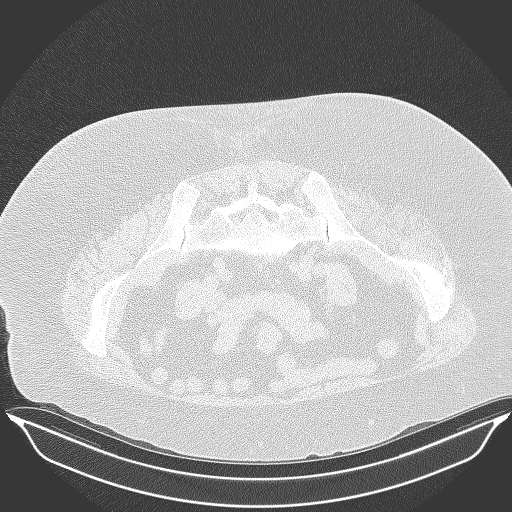
[im 28/31  bone]
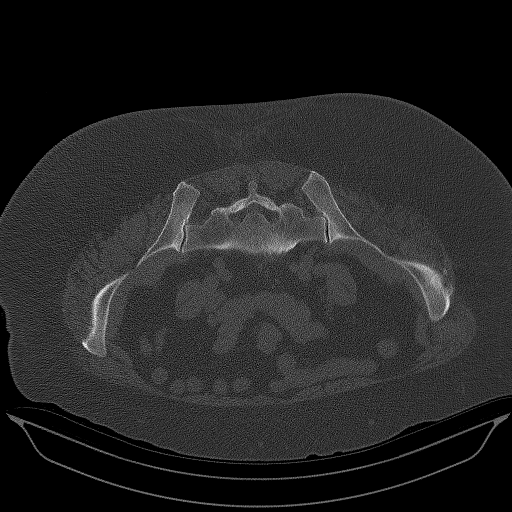

[Series 5: needle -guided injection · axial · 0.84mm/px · z∈[-147,-129]mm · 4 of 31 slices shown (2 of 2)]
[im 4/31  soft-tissue]
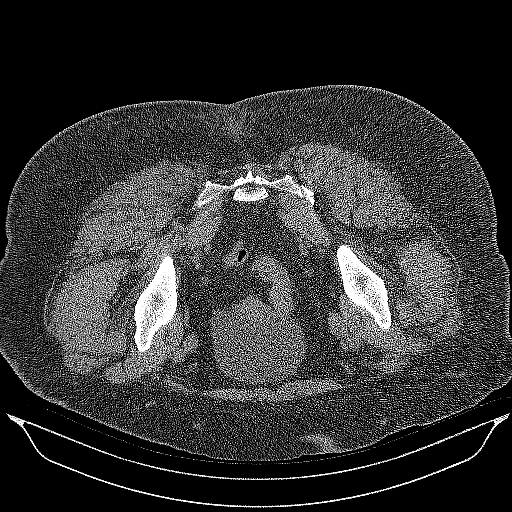
[im 7/31  soft-tissue]
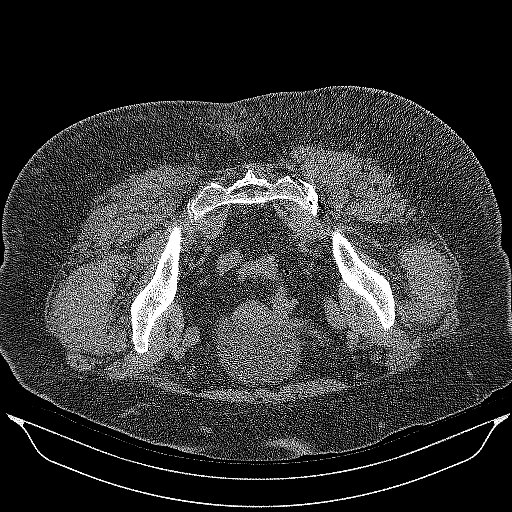
[im 10/31  soft-tissue]
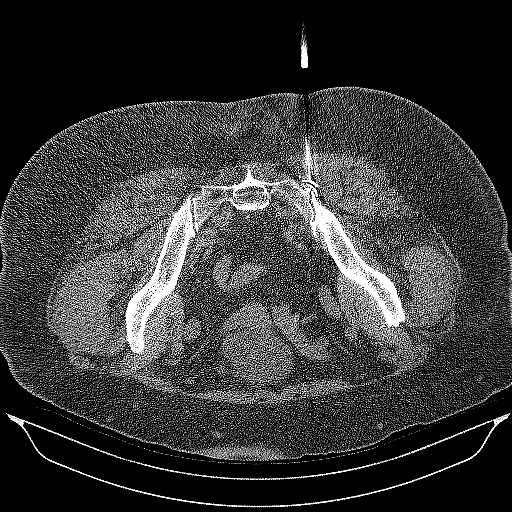
[im 13/31  soft-tissue]
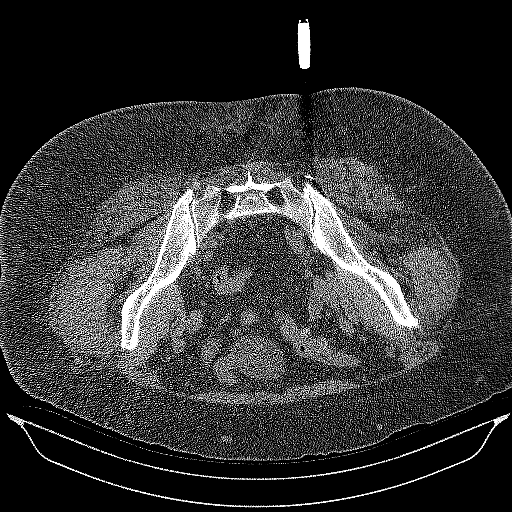

[13 of 32 positions shown; findings below may reference images not displayed]

EXAM:
LEFT CT GUIDED SI JOINT INJECTION



After local anesthesia with 1% lidocaine without epinephrine and
subsequent deep anesthesia, a 20 gauge 3.5 inch spinal needle was
advanced into the LEFT SI joint under intermittent CT guidance.

Once the needle was in satisfactory position, representative image
was captured with the needle demonstrated in the sacroiliac joint.
Contrast injection showed good spread throughout the joint.
Subsequently, 2.5 mL 0.5% Sensorcaine was injected into the LEFT SI
joint. Needles removed and a sterile dressing applied.

No complications were observed.

Postprocedure, the patient reports no relief.
IMPRESSION: Successful CT-guided LEFT SI joint injection.

## 2018-10-11 ENCOUNTER — Telehealth: Payer: Self-pay | Admitting: Neurology

## 2018-10-11 MED ORDER — BUTALBITAL-APAP-CAFFEINE 50-325-40 MG PO TABS: 1.0000 | ORAL_TABLET | Freq: Four times a day (QID) | ORAL | 1 refills | Status: DC | PRN

## 2018-10-11 NOTE — Telephone Encounter (Signed)
Ok sent refill.

## 2018-10-11 NOTE — Telephone Encounter (Signed)
Patient left vm stating the Fiorcet has worked for her migraines and she is requesting a prescription from [place] (this was given at ED). From her note last week:  "Pt is averaging 2 migraines a month which is usually not enough to start preventative. She was given fioricet in ED lets see how that does for rescue. She is aware NOT to use with regular pain medications."  Please advise on refill of medication.

## 2018-10-25 ENCOUNTER — Other Ambulatory Visit: Payer: Self-pay | Admitting: Physician Assistant

## 2018-10-25 DIAGNOSIS — L299 Pruritus, unspecified: Secondary | ICD-10-CM

## 2018-11-01 ENCOUNTER — Ambulatory Visit (INDEPENDENT_AMBULATORY_CARE_PROVIDER_SITE_OTHER): Payer: Medicare Other | Admitting: Physician Assistant

## 2018-11-01 ENCOUNTER — Other Ambulatory Visit: Payer: Self-pay

## 2018-11-01 VITALS — BP 134/73 | HR 75 | Temp 98.8°F | Ht 64.0 in | Wt 239.0 lb

## 2018-11-01 DIAGNOSIS — R350 Frequency of micturition: Secondary | ICD-10-CM

## 2018-11-01 DIAGNOSIS — F419 Anxiety disorder, unspecified: Secondary | ICD-10-CM

## 2018-11-01 DIAGNOSIS — R3915 Urgency of urination: Secondary | ICD-10-CM

## 2018-11-01 DIAGNOSIS — F339 Major depressive disorder, recurrent, unspecified: Secondary | ICD-10-CM

## 2018-11-01 DIAGNOSIS — F5104 Psychophysiologic insomnia: Secondary | ICD-10-CM

## 2018-11-01 LAB — POCT URINALYSIS DIPSTICK
Bilirubin, UA: NEGATIVE
Blood, UA: NEGATIVE
Glucose, UA: NEGATIVE
Ketones, UA: NEGATIVE
Leukocytes, UA: NEGATIVE
Nitrite, UA: NEGATIVE
Protein, UA: NEGATIVE
Spec Grav, UA: 1.01 (ref 1.010–1.025)
Urobilinogen, UA: 0.2 E.U./dL
pH, UA: 7 (ref 5.0–8.0)

## 2018-11-01 MED ORDER — ESCITALOPRAM OXALATE 5 MG PO TABS: 5.0000 mg | ORAL_TABLET | Freq: Every day | ORAL | 1 refills | Status: DC

## 2018-11-01 MED ORDER — BUSPIRONE HCL 5 MG PO TABS: 5.0000 mg | ORAL_TABLET | Freq: Three times a day (TID) | ORAL | 0 refills | Status: DC

## 2018-11-01 MED ORDER — QUETIAPINE FUMARATE 25 MG PO TABS: 25.0000 mg | ORAL_TABLET | Freq: Every day | ORAL | 1 refills | Status: DC

## 2018-11-01 MED ORDER — NITROFURANTOIN MONOHYD MACRO 100 MG PO CAPS: 100.0000 mg | ORAL_CAPSULE | Freq: Two times a day (BID) | ORAL | 0 refills | Status: DC

## 2018-11-01 NOTE — Progress Notes (Signed)
Subjective:    Patient ID: Sheri King, female    DOB: 1955-10-11, 62 y.o.   MRN: [id number]  HPI  Pt is a 63 yo female with chronic pain, HTN who presents to the clinic with increased anxiety, depression, not sleeping, and urinary symptoms.   She is worrying about every little thing. COVID has decreased her interaction with people. She is scared to even sit outside at her house. She finds herself very tearful. She has no motivation to do anything. Nothing brings her happiness right now. Denies any SI/HC.   For the last 3 days she has had increased urination and frequency. Denies any vaginal discharge but has a little bit of external itching.   .. Active Ambulatory Problems    Diagnosis Date Noted  . Anxiety 02/11/2011  . Fibromyalgia 11/05/2010  . Pure hypercholesterolemia 11/05/2010  . Essential (primary) hypertension 11/05/2010  . Avitaminosis D 11/05/2010  . Right arm pain 09/25/2015  . Right shoulder pain 09/25/2015  . Fall 09/25/2015  . Facet arthropathy, lumbar 09/25/2015  . 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 DDD (degenerative disc disease), lumbar 09/25/2015  . Spinal stenosis of lumbar region 09/25/2015  . Chronic pain syndrome 09/25/2015  . Adhesive capsulitis 09/25/2015  . 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 Migraine headache without aura 12/21/2016  . Right-sided chest pain 12/21/2016  . Chronic intractable headache 12/21/2016  . Stress reaction 12/21/2016  . Depression, recurrent ([place]) 01/26/2017  . Class 2 obesity due to excess calories without serious comorbidity with body mass index (BMI) of 39.0 to 39.9 in adult 04/25/2017  . 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 Elevated liver enzymes 01/10/2018  . Pruritus 01/10/2018  . Dyslipidemia (high LDL; low HDL) 01/11/2018  . Pre-diabetes 01/11/2018   Resolved Ambulatory Problems    Diagnosis Date Noted  . Aggrieved 05/29/2011  . Viral gastroenteritis 02/27/2016   Past  Medical History:  Diagnosis Date  . Chronic headaches   . Facet arthropathy of spine 09/25/2015  . Hypertension        Review of Systems  All other systems reviewed and are negative.      Objective:   Physical Exam Vitals signs reviewed.  Constitutional:      Appearance: Normal appearance. She is obese.  Eyes:     Comments: Nystagmus.   Cardiovascular:     Rate and Rhythm: Normal rate and regular rhythm.     Pulses: Normal pulses.  Pulmonary:     Effort: Pulmonary effort is normal.     Breath sounds: Normal breath sounds.  Abdominal:     General: There is no distension.     Tenderness: There is no abdominal tenderness. There is no right CVA tenderness, left CVA tenderness, guarding or rebound.  Neurological:     General: No focal deficit present.     Mental Status: She is alert and oriented to person, place, and time.  Psychiatric:        Mood and Affect: Mood normal.        Behavior: Behavior normal.           Assessment & Plan:  Marland KitchenMarland KitchenTamra was seen today for urinary frequency, anxiety and depression.  Diagnoses and all orders for this visit:  Depression, recurrent ([place]) -     escitalopram (LEXAPRO) 5 MG tablet; Take 1 tablet (5 mg total) by mouth daily.  Urinary frequency -     POCT Urinalysis Dipstick -     nitrofurantoin, macrocrystal-monohydrate, (MACROBID)  100 MG capsule; Take 1 capsule (100 mg total) by mouth 2 (two) times daily. -     Urine Culture  Urgency of urination -     POCT Urinalysis Dipstick -     nitrofurantoin, macrocrystal-monohydrate, (MACROBID) 100 MG capsule; Take 1 capsule (100 mg total) by mouth 2 (two) times daily. -     Urine Culture  Anxiety -     busPIRone (BUSPAR) 5 MG tablet; Take 1 tablet (5 mg total) by mouth 3 (three) times daily.  Psychophysiological insomnia -     QUEtiapine (SEROQUEL) 25 MG tablet; Take 1 tablet (25 mg total) by mouth at bedtime.     .. Depression screen [facility] 2/9 11/01/2018 07/12/2018 01/10/2018  04/23/2017 01/20/2017  Decreased Interest 3 1 2 2 3   Down, Depressed, Hopeless 3 0 2 2 3   PHQ - 2 Score 6 1 4 4 6   Altered sleeping 3 2 2 2 3   Tired, decreased energy 3 2 2 2 3   Change in appetite 2 1 2 2 3   Feeling bad or failure about yourself  0 0 1 0 2  Trouble concentrating 2 0 2 2 3   Moving slowly or fidgety/restless 0 0 0 0 2  Suicidal thoughts 0 0 0 0 0  PHQ-9 Score 16 6 13 12 22   Difficult doing work/chores Very difficult Not difficult at all - - -   .Marland Kitchen. GAD 7 : Generalized Anxiety Score 11/01/2018 07/12/2018 01/10/2018 04/23/2017  Nervous, Anxious, on Edge 3 0 2 2  Control/stop worrying 3 0 2 2  Worry too much - different things 3 0 2 2  Trouble relaxing 3 0 2 2  Restless 2 0 1 1  Easily annoyed or irritable 0 0 1 1  Afraid - awful might happen 2 0 1 1  Total GAD 7 Score 16 0 11 11  Anxiety Difficulty Very difficult Not difficult at all - -      . Results for orders placed or performed in visit on 11/01/18  POCT Urinalysis Dipstick  Result Value Ref Range   Color, UA yellow    Clarity, UA clear    Glucose, UA Negative Negative   Bilirubin, UA negative    Ketones, UA negative    Spec Grav, UA 1.010 1.010 - 1.025   Blood, UA negative    pH, UA 7.0 5.0 - 8.0   Protein, UA Negative Negative   Urobilinogen, UA 0.2 0.2 or 1.0 E.U./dL   Nitrite, UA negative    Leukocytes, UA Negative Negative   Appearance     Odor      Pt is very anxious and depressed today. Discussed some things she can do to help at home such as sit outside/music/baths/oils etc. Added lexapro and buspar. Continue to use xanax only as needed. Added seroquel for sleep. Follow up in 4 weeks.   UA negative for nitrates, leuks, blood. Will culture. Due to symptoms and patients concern will treat empirically with macrobid.   Marland Kitchen..Spent 30 minutes with patient and greater than 50 percent of visit spent counseling patient regarding treatment plan.

## 2018-11-01 NOTE — Patient Instructions (Addendum)
Start seroquel at bedtime.  Start lexapro in the morning.  Take buspar three times day.   Start macrobid for urinary symptoms for 7 days.    Urinary Tract Infection, Adult A urinary tract infection (UTI) is an infection of any part of the urinary tract. The urinary tract includes:  The kidneys.  The ureters.  The bladder.  The urethra. These organs make, store, and get rid of pee (urine) in the body. What are the causes? This is caused by germs (bacteria) in your genital area. These germs grow and cause swelling (inflammation) of your urinary tract. What increases the risk? You are more likely to develop this condition if:  You have a small, thin tube (catheter) to drain pee.  You cannot control when you pee or poop (incontinence).  You are female, and: ? You use these methods to prevent pregnancy: ? A medicine that kills sperm (spermicide). ? A device that blocks sperm (diaphragm). ? You have low levels of a female hormone (estrogen). ? You are pregnant.  You have genes that add to your risk.  You are sexually active.  You take antibiotic medicines.  You have trouble peeing because of: ? A prostate that is bigger than normal, if you are female. ? A blockage in the part of your body that drains pee from the bladder (urethra). ? A kidney stone. ? A nerve condition that affects your bladder (neurogenic bladder). ? Not getting enough to drink. ? Not peeing often enough.  You have other conditions, such as: ? Diabetes. ? A weak disease-fighting system (immune system). ? Sickle cell disease. ? Gout. ? Injury of the spine. What are the signs or symptoms? Symptoms of this condition include:  Needing to pee right away (urgently).  Peeing often.  Peeing small amounts often.  Pain or burning when peeing.  Blood in the pee.  Pee that smells bad or not like normal.  Trouble peeing.  Pee that is cloudy.  Fluid coming from the vagina, if you are  female.  Pain in the belly or lower back. Other symptoms include:  Throwing up (vomiting).  No urge to eat.  Feeling mixed up (confused).  Being tired and grouchy (irritable).  A fever.  Watery poop (diarrhea). How is this treated? This condition may be treated with:  Antibiotic medicine.  Other medicines.  Drinking enough water. Follow these instructions at home:  Medicines  Take over-the-counter and prescription medicines only as told by your doctor.  If you were prescribed an antibiotic medicine, take it as told by your doctor. Do not stop taking it even if you start to feel better. General instructions  Make sure you: ? Pee until your bladder is empty. ? Do not hold pee for a long time. ? Empty your bladder after sex. ? Wipe from front to back after pooping if you are a female. Use each tissue one time when you wipe.  Drink enough fluid to keep your pee pale yellow.  Keep all follow-up visits as told by your doctor. This is important. Contact a doctor if:  You do not get better after 1-2 days.  Your symptoms go away and then come back. Get help right away if:  You have very bad back pain.  You have very bad pain in your lower belly.  You have a fever.  You are sick to your stomach (nauseous).  You are throwing up. Summary  A urinary tract infection (UTI) is an infection of any part of  the urinary tract.  This condition is caused by germs in your genital area.  There are many risk factors for a UTI. These include having a small, thin tube to drain pee and not being able to control when you pee or poop.  Treatment includes antibiotic medicines for germs.  Drink enough fluid to keep your pee pale yellow. This information is not intended to replace advice given to you by your health care provider. Make sure you discuss any questions you have with your health care provider. Document Released: 08/19/2007 Document Revised: 02/17/2018 Document  Reviewed: 09/09/2017 Elsevier Patient Education  2020 [employer].

## 2018-11-01 NOTE — Progress Notes (Signed)
Patient ID: Sheri King, female   DOB: 1955-08-24, 63 y.o.   MRN: [id number]

## 2018-11-03 LAB — URINE CULTURE
MICRO NUMBER:: 783721
SPECIMEN QUALITY:: ADEQUATE

## 2018-11-03 NOTE — Progress Notes (Signed)
No urinary tract infection. Can stop abx.

## 2018-11-03 NOTE — Progress Notes (Signed)
She is she is willing to come in for abdominal xray to assess for stones. If yes ok to order. She can finish abx if she wants to but no infection seen on culture.

## 2018-11-04 ENCOUNTER — Encounter: Payer: Self-pay | Admitting: Physician Assistant

## 2018-11-23 ENCOUNTER — Other Ambulatory Visit: Payer: Self-pay | Admitting: Physician Assistant

## 2018-11-23 DIAGNOSIS — L299 Pruritus, unspecified: Secondary | ICD-10-CM

## 2018-11-23 DIAGNOSIS — F419 Anxiety disorder, unspecified: Secondary | ICD-10-CM

## 2018-11-29 ENCOUNTER — Ambulatory Visit (INDEPENDENT_AMBULATORY_CARE_PROVIDER_SITE_OTHER): Payer: Medicare Other | Admitting: Physician Assistant

## 2018-11-29 ENCOUNTER — Encounter: Payer: Self-pay | Admitting: Physician Assistant

## 2018-11-29 VITALS — Ht 64.0 in | Wt 239.0 lb

## 2018-11-29 DIAGNOSIS — F419 Anxiety disorder, unspecified: Secondary | ICD-10-CM | POA: Diagnosis not present

## 2018-11-29 DIAGNOSIS — F339 Major depressive disorder, recurrent, unspecified: Secondary | ICD-10-CM | POA: Diagnosis not present

## 2018-11-29 DIAGNOSIS — G894 Chronic pain syndrome: Secondary | ICD-10-CM

## 2018-11-29 DIAGNOSIS — G4700 Insomnia, unspecified: Secondary | ICD-10-CM

## 2018-11-29 MED ORDER — BUSPIRONE HCL 10 MG PO TABS: 10.0000 mg | ORAL_TABLET | Freq: Three times a day (TID) | ORAL | 2 refills | Status: DC

## 2018-11-29 MED ORDER — ESCITALOPRAM OXALATE 10 MG PO TABS: 10.0000 mg | ORAL_TABLET | Freq: Every day | ORAL | 2 refills | Status: DC

## 2018-11-29 MED ORDER — QUETIAPINE FUMARATE 50 MG PO TABS: 50.0000 mg | ORAL_TABLET | Freq: Every day | ORAL | 2 refills | Status: DC

## 2018-11-29 NOTE — Progress Notes (Signed)
PHQ9-GAD7 completed.  Still having trouble sleeping.  Needs refills on mood medications if still on same dose.  She states they aren't working well.

## 2018-11-29 NOTE — Progress Notes (Signed)
Patient ID: Sheri King, female   DOB: [birth year], 63 y.o.   MRN: [id number] .Marland Kitchen.Virtual Visit via Video Note  I connected with Sheri King on 11/29/18 at  1:20 PM EDT by a video enabled telemedicine application and verified that I am speaking with the correct person using two identifiers.  Location: Patient: home Provider: clinic   I discussed the limitations of evaluation and management by telemedicine and the availability of in person appointments. The patient expressed understanding and agreed to proceed.  History of Present Illness: Pt is a 63 yo female with chronic pain, anxiety, depression, insomnia who calls in for 1 month follow up. She was started on seroquel, lexapro and buspar at last visit. She has had very little improvement. She continues to not sleep well. She states seroquel makes her  "a little easier to go to sleep but not stay asleep".  Her pain is stable most days a 6/10. She has pain management appt tomorrow. She has no motivation to do anything. She has a pool at her home and she has not left inside of her home since 18th of august. She denies any SI/HC. She has no hobbies she just watches TV. She is very scared of covid and refuses to leave her house or hang out with anyone.   .. Active Ambulatory Problems    Diagnosis Date Noted  . Anxiety 02/11/2011  . Fibromyalgia 11/05/2010  . Pure hypercholesterolemia 11/05/2010  . Essential (primary) hypertension 11/05/2010  . Avitaminosis D 11/05/2010  . Right arm pain 09/25/2015  . Right shoulder pain 09/25/2015  . Fall 09/25/2015  . Facet arthropathy, lumbar 09/25/2015  . 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 DDD (degenerative disc disease), lumbar 09/25/2015  . Spinal stenosis of lumbar region 09/25/2015  . Chronic pain syndrome 09/25/2015  . Adhesive capsulitis 09/25/2015  . 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 Migraine headache without aura 12/21/2016  . Right-sided chest pain 12/21/2016  . Chronic  intractable headache 12/21/2016  . Stress reaction 12/21/2016  . Depression, recurrent ([facility]) 01/26/2017  . Class 2 obesity due to excess calories without serious comorbidity with body mass index (BMI) of 39.0 to 39.9 in adult 04/25/2017  . 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 Elevated liver enzymes 01/10/2018  . Pruritus 01/10/2018  . Dyslipidemia (high LDL; low HDL) 01/11/2018  . Pre-diabetes 01/11/2018   Resolved Ambulatory Problems    Diagnosis Date Noted  . Aggrieved 05/29/2011  . Viral gastroenteritis 02/27/2016   Past Medical History:  Diagnosis Date  . Chronic headaches   . Facet arthropathy of spine 09/25/2015  . Hypertension    Reviewed med, allergies, problem list.     Observations/Objective: No acute distress.  Normal breathing.  Flat affect on the phone.   .. [facility] screen [facility] 11/29/2018 11/01/2018 07/12/2018 01/10/2018 04/23/2017  Decreased Interest 3 3 1 2 2   Down, Depressed, Hopeless 1 3 0 2 2  PHQ - 2 Score 4 6 1 4 4   Altered sleeping 3 3 2 2 2   Tired, decreased energy 2 3 2 2 2   Change in appetite 2 2 1 2 2   Feeling bad or failure about yourself  0 0 0 1 0  Trouble concentrating 2 2 0 2 2  Moving slowly or fidgety/restless 0 0 0 0 0  Suicidal thoughts 0 0 0 0 0  PHQ-9 Score 13 16 6 13 12   Difficult doing work/chores Somewhat difficult Very difficult Not difficult at all - -   ..Marland Kitchen  GAD 7 : Generalized Anxiety Score 11/29/2018 11/01/2018 07/12/2018 01/10/2018  Nervous, Anxious, on Edge 3 3 0 2  Control/stop worrying 3 3 0 2  Worry too much - different things 3 3 0 2  Trouble relaxing 3 3 0 2  Restless 3 2 0 1  Easily annoyed or irritable 0 0 0 1  Afraid - awful might happen 3 2 0 1  Total GAD 7 Score 18 16 0 11  Anxiety Difficulty Not difficult at all Very difficult Not difficult at all -     Assessment and Plan: Marland KitchenMarland KitchenTamra was seen today for depression.  Diagnoses and all orders for this visit:  Chronic pain  syndrome  Anxiety -     busPIRone (BUSPAR) 10 MG tablet; Take 1 tablet (10 mg total) by mouth 3 (three) times daily. -     escitalopram (LEXAPRO) 10 MG tablet; Take 1 tablet (10 mg total) by mouth daily.  Depression, recurrent ([place]) -     escitalopram (LEXAPRO) 10 MG tablet; Take 1 tablet (10 mg total) by mouth daily.  Insomnia, unspecified type -     QUEtiapine (SEROQUEL) 50 MG tablet; Take 1 tablet (50 mg total) by mouth at bedtime.   GAD and PHQ are minimally changed. Increased lexapro, buspar, seroquel. Follow up in 4-6 weeks. Pt declined any counseling. Encouraged patient to sit outside or call one person a day. I do think fear of covid is effecting her mental health. Try to walk around house 1-5 times a day.    Follow Up Instructions:    I discussed the assessment and treatment plan with the patient. The patient was provided an opportunity to ask questions and all were answered. The patient agreed with the plan and demonstrated an understanding of the instructions.   The patient was advised to call back or seek an in-person evaluation if the symptoms worsen or if the condition fails to improve as anticipated.     Iran Planas, PA-C

## 2018-12-20 ENCOUNTER — Other Ambulatory Visit: Payer: Self-pay | Admitting: Physician Assistant

## 2018-12-20 NOTE — Telephone Encounter (Signed)
Last OV 11/29/18  Last RX sent 10/11/18 with 1 RF #20

## 2018-12-24 ENCOUNTER — Other Ambulatory Visit: Payer: Self-pay | Admitting: Physician Assistant

## 2018-12-24 DIAGNOSIS — G894 Chronic pain syndrome: Secondary | ICD-10-CM

## 2018-12-27 ENCOUNTER — Other Ambulatory Visit: Payer: Self-pay

## 2018-12-27 ENCOUNTER — Ambulatory Visit (INDEPENDENT_AMBULATORY_CARE_PROVIDER_SITE_OTHER): Payer: Medicare Other | Admitting: Physician Assistant

## 2018-12-27 VITALS — BP 152/71 | HR 79 | Ht 64.0 in | Wt 246.0 lb

## 2018-12-27 DIAGNOSIS — G8929 Other chronic pain: Secondary | ICD-10-CM

## 2018-12-27 DIAGNOSIS — R519 Headache, unspecified: Secondary | ICD-10-CM | POA: Diagnosis not present

## 2018-12-27 DIAGNOSIS — G4452 New daily persistent headache (NDPH): Secondary | ICD-10-CM | POA: Insufficient documentation

## 2018-12-27 DIAGNOSIS — F339 Major depressive disorder, recurrent, unspecified: Secondary | ICD-10-CM

## 2018-12-27 DIAGNOSIS — G43001 Migraine without aura, not intractable, with status migrainosus: Secondary | ICD-10-CM

## 2018-12-27 DIAGNOSIS — F419 Anxiety disorder, unspecified: Secondary | ICD-10-CM | POA: Diagnosis not present

## 2018-12-27 DIAGNOSIS — I10 Essential (primary) hypertension: Secondary | ICD-10-CM

## 2018-12-27 DIAGNOSIS — G4700 Insomnia, unspecified: Secondary | ICD-10-CM

## 2018-12-27 MED ORDER — BUSPIRONE HCL 10 MG PO TABS: 10.0000 mg | ORAL_TABLET | Freq: Three times a day (TID) | ORAL | 2 refills | Status: DC

## 2018-12-27 MED ORDER — FLUCONAZOLE 150 MG PO TABS: 150.0000 mg | ORAL_TABLET | Freq: Once | ORAL | 0 refills | Status: AC

## 2018-12-27 MED ORDER — QUETIAPINE FUMARATE 50 MG PO TABS: ORAL_TABLET | ORAL | 2 refills | Status: DC

## 2018-12-27 MED ORDER — ESCITALOPRAM OXALATE 20 MG PO TABS: 20.0000 mg | ORAL_TABLET | Freq: Every day | ORAL | 0 refills | Status: DC

## 2018-12-27 NOTE — Patient Instructions (Addendum)
Increase lexapro 20mg  daily.  Increase seroquel 1 and 1/2 tablets.   Galcanezumab injection What is this medicine? GALCANEZUMAB (gal ka NEZ ue mab) is used to prevent migraines and treat cluster headaches. This medicine may be used for other purposes; ask your health care provider or pharmacist if you have questions. COMMON BRAND NAME(S): Emgality What should I tell my health care provider before I take this medicine? They need to know if you have any of these conditions:  an unusual or allergic reaction to galcanezumab, other medicines, foods, dyes, or preservatives  pregnant or trying to get pregnant  breast-feeding How should I use this medicine? This medicine is for injection under the skin. You will be taught how to prepare and give this medicine. Use exactly as directed. Take your medicine at regular intervals. Do not take your medicine more often than directed. It is important that you put your used needles and syringes in a special sharps container. Do not put them in a trash can. If you do not have a sharps container, call your pharmacist or healthcare provider to get one. Talk to your pediatrician regarding the use of this medicine in children. Special care may be needed. Overdosage: If you think you have taken too much of this medicine contact a poison control center or emergency room at once. NOTE: This medicine is only for you. Do not share this medicine with others. What if I miss a dose? If you miss a dose, take it as soon as you can. If it is almost time for your next dose, take only that dose. Do not take double or extra doses. What may interact with this medicine? Interactions are not expected. This list may not describe all possible interactions. Give your health care provider a list of all the medicines, herbs, non-prescription drugs, or dietary supplements you use. Also tell them if you smoke, drink alcohol, or use illegal drugs. Some items may interact with your  medicine. What should I watch for while using this medicine? Tell your doctor or healthcare professional if your symptoms do not start to get better or if they get worse. What side effects may I notice from receiving this medicine? Side effects that you should report to your doctor or health care professional as soon as possible:  allergic reactions like skin rash, itching or hives, swelling of the face, lips, or tongue Side effects that usually do not require medical attention (report these to your doctor or health care professional if they continue or are bothersome):  pain, redness, or irritation at site where injected This list may not describe all possible side effects. Call your doctor for medical advice about side effects. You may report side effects to FDA at 1-800-FDA-1088. Where should I keep my medicine? Keep out of the reach of children. You will be instructed on how to store this medicine. Throw away any unused medicine after the expiration date on the label. NOTE: This sheet is a summary. It may not cover all possible information. If you have questions about this medicine, talk to your doctor, pharmacist, or health care provider.  2020 Elsevier/Gold Standard (2017-08-18 12:03:23)   .2019-06-07Keep headache diary and bring to your next appointment  Lifestyle changes to improve headache frequency:  Regular sleep routine -- go to bed at the same time and get up at the same time-- everyday, even on the weekends Regular exercise-40 minutes 3-4 times weekly (150 minutes weekly) Maintaining a healthy weight--portion control Avoid caffeine. Decaf tea or  coffee is ok. (limit to150 mg daily) Avoid soda (caffeine or non) Avoid artificial sweetners Avoid alcohol Avoid smoking/secondhand smoke Avoid ibuprofen, acetaminophen, naproxen, pseudoephedrine and other OTC analgesics Avoid opioids, narcotics and prescribed pain medication  Rebound Headaches/Medication overuse headache -- Overusing  rescue medications more than twice weekly will worsen headache frequency and intensity over time. Will improve as you wean over the counter medications, ie-- Ibuprofen/ Tylenol/Excedrin, etc. and caffeine- Headaches may get worse before getting better as you withdraw from medication overuse and/or caffeine   Start Magnesium 400-500mg  twice daily Start Vitamin B2 100-200mg  twice daily OR Migrelief (on [employer])

## 2018-12-27 NOTE — Progress Notes (Signed)
Subjective:    Patient ID: Sheri King, female    DOB: Jul 27, 1955, 63 y.o.   MRN: [id number]  HPI  Pt is a 63 yo female with HTN, migraines, chronic pain, anxiety, depression, insomnia who needs refills.   We have been trying to adjust medications to help anxiety and depression. She is getting very little benefit. She is sleeping a little better. She is still very anxious and has no motivation to do anything. She is having daily headaches and then turning into migraines. The only thing that helps is sleeping and dark room. No vision changes. Some headaches are behind the eyes, lower neck and then switching from one side of head to the other.   She takes daily pain medication and thinks it could be that.   .. Active Ambulatory Problems    Diagnosis Date Noted  . Anxiety 02/11/2011  . Fibromyalgia 11/05/2010  . Pure hypercholesterolemia 11/05/2010  . Essential (primary) hypertension 11/05/2010  . Avitaminosis D 11/05/2010  . Right arm pain 09/25/2015  . Right shoulder pain 09/25/2015  . Fall 09/25/2015  . Facet arthropathy, lumbar 09/25/2015  . 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 DDD (degenerative disc disease), lumbar 09/25/2015  . Spinal stenosis of lumbar region 09/25/2015  . Chronic pain syndrome 09/25/2015  . Adhesive capsulitis 09/25/2015  . 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 Migraine headache without aura 12/21/2016  . Right-sided chest pain 12/21/2016  . Chronic intractable headache 12/21/2016  . Stress reaction 12/21/2016  . Depression, recurrent (HCC) 01/26/2017  . Class 2 obesity due to excess calories without serious comorbidity with body mass index (BMI) of 39.0 to 39.9 in adult 04/25/2017  . 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 Elevated liver enzymes 01/10/2018  . Pruritus 01/10/2018  . Dyslipidemia (high LDL; low HDL) 01/11/2018  . Pre-diabetes 01/11/2018  . New daily persistent headache 12/27/2018  . Insomnia 12/30/2018    Resolved Ambulatory Problems    Diagnosis Date Noted  . Aggrieved 05/29/2011  . Viral gastroenteritis 02/27/2016   Past Medical History:  Diagnosis Date  . Chronic headaches   . Facet arthropathy of spine 09/25/2015  . Hypertension      Review of Systems  All other systems reviewed and are negative.      Objective:   Physical Exam Vitals signs reviewed.  Constitutional:      Appearance: Normal appearance.  HENT:     Head: Normocephalic.  Eyes:     Comments: Congential nystagmus.  Cardiovascular:     Rate and Rhythm: Normal rate and regular rhythm.  Pulmonary:     Effort: Pulmonary effort is normal.     Breath sounds: Normal breath sounds.  Neurological:     General: No focal deficit present.     Mental Status: She is alert and oriented to person, place, and time.  Psychiatric:     Comments: Flat affect.       .. Depression screen [facility] 2/9 12/27/2018 11/29/2018 11/01/2018 07/12/2018 01/10/2018  Decreased Interest 3 3 3 1 2   Down, Depressed, Hopeless 2 1 3  0 2  PHQ - 2 Score 5 4 6 1 4   Altered sleeping 3 3 3 2 2   Tired, decreased energy 3 2 3 2 2   Change in appetite 3 2 2 1 2   Feeling bad or failure about yourself  0 0 0 0 1  Trouble concentrating 2 2 2  0 2  Moving slowly or fidgety/restless 0 0 0 0 0  Suicidal  thoughts 0 0 0 0 0  PHQ-9 Score 16 13 16 6 13   Difficult doing work/chores Somewhat difficult Somewhat difficult Very difficult Not difficult at all -   .. GAD 7 : Generalized Anxiety Score 12/27/2018 11/29/2018 11/01/2018 07/12/2018  Nervous, Anxious, on Edge 3 3 3  0  Control/stop worrying 3 3 3  0  Worry too much - different things 3 3 3  0  Trouble relaxing 3 3 3  0  Restless 2 3 2  0  Easily annoyed or irritable 0 0 0 0  Afraid - awful might happen 2 3 2  0  Total GAD 7 Score 16 18 16  0  Anxiety Difficulty Somewhat difficult Not difficult at all Very difficult Not difficult at all        Assessment & Plan:  Marland KitchenMarland KitchenTamra was seen today for  insomnia.  Diagnoses and all orders for this visit:  Chronic intractable headache, unspecified headache type  Anxiety -     escitalopram (LEXAPRO) 20 MG tablet; Take 1 tablet (20 mg total) by mouth daily. -     busPIRone (BUSPAR) 10 MG tablet; Take 1 tablet (10 mg total) by mouth 3 (three) times daily.  Essential (primary) hypertension  Migraine without aura and with status migrainosus, not intractable  Depression, recurrent (HCC) -     escitalopram (LEXAPRO) 20 MG tablet; Take 1 tablet (20 mg total) by mouth daily.  Insomnia, unspecified type -     QUEtiapine (SEROQUEL) 50 MG tablet; Take one and one-half tablet 1 hour before bed.  Other orders -     fluconazole (DIFLUCAN) 150 MG tablet; Take 1 tablet (150 mg total) by mouth once for 1 dose.   Increased lexapro to 20mg .  Increased seroquel to 1 and 1/2 tablets.   Discussed HA's discussed other triggers. Could certainly be pain medication causing headaches.  Discussed emgaility injections. Pt will consider.   Follow up in 6 weeks.

## 2018-12-30 ENCOUNTER — Encounter: Payer: Self-pay | Admitting: Physician Assistant

## 2018-12-30 DIAGNOSIS — G4700 Insomnia, unspecified: Secondary | ICD-10-CM | POA: Insufficient documentation

## 2019-01-17 ENCOUNTER — Other Ambulatory Visit: Payer: Self-pay | Admitting: Neurology

## 2019-01-17 ENCOUNTER — Telehealth: Payer: Self-pay

## 2019-01-17 MED ORDER — EMGALITY 120 MG/ML ~~LOC~~ SOAJ: 2.0000 "pen " | SUBCUTANEOUS | 0 refills | Status: DC

## 2019-01-17 NOTE — Telephone Encounter (Signed)
Sent to pharmacy. First dose is loading dose and use 2 pens. Next month is 1 pen monthly.

## 2019-01-17 NOTE — Telephone Encounter (Signed)
Patient called and left vm that she is ready to start migraine injectables that were discussed at last visit. She believes this was emgality. She would like sent to [facility] [address]

## 2019-01-17 NOTE — Telephone Encounter (Signed)
Sheri King states she has done the research on the injectable migraine medication. She would like to try the medication.

## 2019-01-18 NOTE — Telephone Encounter (Signed)
Patient advised of recommendations.   Jenny Reichmann,  Patient states the [place] requires a PA.  Thanks

## 2019-01-22 ENCOUNTER — Other Ambulatory Visit: Payer: Self-pay | Admitting: Physician Assistant

## 2019-01-22 DIAGNOSIS — G894 Chronic pain syndrome: Secondary | ICD-10-CM

## 2019-01-23 NOTE — Telephone Encounter (Signed)
Last filled 12/26/2018 #120 with no refills. Please advise.

## 2019-01-27 ENCOUNTER — Encounter: Payer: Self-pay | Admitting: Physician Assistant

## 2019-01-27 ENCOUNTER — Other Ambulatory Visit: Payer: Self-pay

## 2019-01-27 ENCOUNTER — Ambulatory Visit (INDEPENDENT_AMBULATORY_CARE_PROVIDER_SITE_OTHER): Payer: Medicare Other | Admitting: Physician Assistant

## 2019-01-27 VITALS — BP 139/67 | HR 70 | Temp 98.2°F | Wt 248.0 lb

## 2019-01-27 DIAGNOSIS — G478 Other sleep disorders: Secondary | ICD-10-CM | POA: Diagnosis not present

## 2019-01-27 DIAGNOSIS — R519 Headache, unspecified: Secondary | ICD-10-CM | POA: Diagnosis not present

## 2019-01-27 DIAGNOSIS — Z6835 Body mass index (BMI) 35.0-35.9, adult: Secondary | ICD-10-CM

## 2019-01-27 DIAGNOSIS — I10 Essential (primary) hypertension: Secondary | ICD-10-CM

## 2019-01-27 DIAGNOSIS — E6609 Other obesity due to excess calories: Secondary | ICD-10-CM

## 2019-01-27 DIAGNOSIS — R5383 Other fatigue: Secondary | ICD-10-CM

## 2019-01-27 NOTE — Telephone Encounter (Signed)
Sent PA through cover my meds waiting on determination. - CF 

## 2019-01-27 NOTE — Progress Notes (Signed)
Subjective:    Patient ID: Sheri King, female    DOB: 08-03-1955, 63 y.o.   MRN: [id number]  HPI  Pt is a 63 yo obese female with HTN, migraines, chronic pain, Depression/anxiety, headaches and migraines who comes into the clinic with variable BP readings and daily headaches.   She has been checking her BP at home and ranging 130's to 160's on top. She has a headache most mornings. Usually gets better by afternoon. She denies any snoring but wakes up a lot at night and wakes up tired. No CP, SOB, palpitations, vision changes.   Pt was never called about emgality. Per pharmacy needed PA. She is on verapamil. Failed topamax/inderal. She uses fioricet for rescue.   .. Active Ambulatory Problems    Diagnosis Date Noted  . Anxiety 02/11/2011  . Fibromyalgia 11/05/2010  . Pure hypercholesterolemia 11/05/2010  . Essential hypertension 11/05/2010  . Avitaminosis D 11/05/2010  . Right arm pain 09/25/2015  . Right shoulder pain 09/25/2015  . Fall 09/25/2015  . Facet arthropathy, lumbar 09/25/2015  . 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 DDD (degenerative disc disease), lumbar 09/25/2015  . Spinal stenosis of lumbar region 09/25/2015  . Chronic pain syndrome 09/25/2015  . Adhesive capsulitis 09/25/2015  . 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 Migraine headache without aura 12/21/2016  . Right-sided chest pain 12/21/2016  . Chronic intractable headache 12/21/2016  . Stress reaction 12/21/2016  . Depression, recurrent (HCC) 01/26/2017  . Class 2 obesity due to excess calories without serious comorbidity with body mass index (BMI) of 35.0 to 35.9 in adult 04/25/2017  . 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 Elevated liver enzymes 01/10/2018  . Pruritus 01/10/2018  . Dyslipidemia (high LDL; low HDL) 01/11/2018  . Pre-diabetes 01/11/2018  . New daily persistent headache 12/27/2018  . Insomnia 12/30/2018  . Non-restorative sleep 01/27/2019  . Frequent  headaches 01/27/2019  . Fatigue 01/27/2019   Resolved Ambulatory Problems    Diagnosis Date Noted  . Aggrieved 05/29/2011  . Viral gastroenteritis 02/27/2016   Past Medical History:  Diagnosis Date  . Chronic headaches   . Facet arthropathy of spine 09/25/2015  . Hypertension      Review of Systems  All other systems reviewed and are negative.      Objective:   Physical Exam Vitals signs reviewed.  Constitutional:      Appearance: Normal appearance. She is obese.  HENT:     Head: Normocephalic.  Cardiovascular:     Rate and Rhythm: Normal rate and regular rhythm.     Pulses: Normal pulses.  Pulmonary:     Effort: Pulmonary effort is normal.  Neurological:     Mental Status: She is alert and oriented to person, place, and time.  Psychiatric:        Mood and Affect: Mood normal.           Assessment & Plan:  Marland KitchenMarland KitchenDiagnoses and all orders for this visit:  Frequent headaches -     Home sleep test  Non-restorative sleep -     Home sleep test  Fatigue, unspecified type -     Home sleep test  Class 2 obesity due to excess calories without serious comorbidity with body mass index (BMI) of 35.0 to 35.9 in adult -     Home sleep test  Essential hypertension -     Home sleep test   BP in office good. Could be some sleep apnea. Pt has multiple risk  factors due to obesity, HTN, fatigue, headache. Will get home sleep test. Continue to monitor BP. Keep log. Report readings to me. Next time in office bring cuff in to compare.

## 2019-01-31 NOTE — Telephone Encounter (Signed)
Received fax from [facility] and they approved 1 time loading dose until 02/10/19.   Refernce: [id number]

## 2019-02-03 ENCOUNTER — Telehealth: Payer: Self-pay

## 2019-02-03 ENCOUNTER — Other Ambulatory Visit: Payer: Self-pay | Admitting: Physician Assistant

## 2019-02-03 ENCOUNTER — Other Ambulatory Visit: Payer: Self-pay | Admitting: Family Medicine

## 2019-02-03 DIAGNOSIS — G894 Chronic pain syndrome: Secondary | ICD-10-CM

## 2019-02-03 NOTE — Telephone Encounter (Signed)
Noted  

## 2019-02-03 NOTE — Telephone Encounter (Signed)
This was sent 1 week ago.

## 2019-02-03 NOTE — Telephone Encounter (Signed)
The Emgality co-pay is still high. She wanted a coupon card. I only card is on-line. Patient doesn't have access to the Internet. I signed her up for the coupon card and gave her the information.   RXBIN - [id number] PCN - [id number] GRP - [id number] ID [id number]

## 2019-02-06 NOTE — Telephone Encounter (Signed)
Thanks! GREAT patient care.

## 2019-02-06 NOTE — Telephone Encounter (Signed)
Pt should have a refill

## 2019-03-03 ENCOUNTER — Other Ambulatory Visit: Payer: Self-pay | Admitting: Physician Assistant

## 2019-03-03 DIAGNOSIS — L299 Pruritus, unspecified: Secondary | ICD-10-CM

## 2019-03-05 ENCOUNTER — Other Ambulatory Visit: Payer: Self-pay | Admitting: Physician Assistant

## 2019-03-05 DIAGNOSIS — G894 Chronic pain syndrome: Secondary | ICD-10-CM

## 2019-03-13 ENCOUNTER — Other Ambulatory Visit: Payer: Self-pay | Admitting: Physician Assistant

## 2019-03-13 DIAGNOSIS — F339 Major depressive disorder, recurrent, unspecified: Secondary | ICD-10-CM

## 2019-03-13 DIAGNOSIS — F419 Anxiety disorder, unspecified: Secondary | ICD-10-CM

## 2019-03-15 ENCOUNTER — Other Ambulatory Visit: Payer: Self-pay | Admitting: Family Medicine

## 2019-03-29 ENCOUNTER — Ambulatory Visit: Payer: Medicare Other | Admitting: Physician Assistant

## 2019-04-11 ENCOUNTER — Other Ambulatory Visit: Payer: Self-pay | Admitting: Physician Assistant

## 2019-04-11 DIAGNOSIS — G4700 Insomnia, unspecified: Secondary | ICD-10-CM

## 2019-04-13 ENCOUNTER — Other Ambulatory Visit: Payer: Self-pay | Admitting: Physician Assistant

## 2019-04-13 DIAGNOSIS — G894 Chronic pain syndrome: Secondary | ICD-10-CM

## 2019-04-14 NOTE — Telephone Encounter (Signed)
She is for chronic pain.

## 2019-04-14 NOTE — Telephone Encounter (Signed)
Should patient continue muscle relaxer this often? Just filled 03/06/2019 #120.

## 2019-04-25 ENCOUNTER — Other Ambulatory Visit: Payer: Self-pay | Admitting: Physician Assistant

## 2019-04-25 DIAGNOSIS — G4700 Insomnia, unspecified: Secondary | ICD-10-CM

## 2019-05-15 ENCOUNTER — Encounter (HOSPITAL_BASED_OUTPATIENT_CLINIC_OR_DEPARTMENT_OTHER): Payer: Medicare Other | Admitting: Internal Medicine

## 2019-06-07 ENCOUNTER — Telehealth (INDEPENDENT_AMBULATORY_CARE_PROVIDER_SITE_OTHER): Payer: Medicare Other | Admitting: Physician Assistant

## 2019-06-07 ENCOUNTER — Encounter: Payer: Self-pay | Admitting: Physician Assistant

## 2019-06-07 VITALS — BP 132/84 | HR 70 | Temp 98.6°F | Ht 64.0 in | Wt 248.0 lb

## 2019-06-07 DIAGNOSIS — R110 Nausea: Secondary | ICD-10-CM

## 2019-06-07 DIAGNOSIS — R1084 Generalized abdominal pain: Secondary | ICD-10-CM

## 2019-06-07 MED ORDER — SUCRALFATE 1 G PO TABS: 1.0000 g | ORAL_TABLET | Freq: Three times a day (TID) | ORAL | 0 refills | Status: DC

## 2019-06-07 NOTE — Progress Notes (Deleted)
Started Sunday Nausea Abdominal pain - radiates to back (not a particular side) Lethargic No appetite Headache

## 2019-06-07 NOTE — Progress Notes (Signed)
Patient ID: Sheri King, female   DOB: [birth year], 64 y.o.   MRN: [id number] .Marland KitchenVirtual Visit via Video Note  I connected with Sheri King on 06/07/19 at 10:30 AM EDT by a video enabled telemedicine application and verified that I am speaking with the correct person using two identifiers.  Location: Patient: home Provider: clinic   I discussed the limitations of evaluation and management by telemedicine and the availability of in person appointments. The patient expressed understanding and agreed to proceed.  History of Present Illness: Patient is a 64 year old female with hypertension, migraines, chronic pain who calls into the clinic with new abdominal pain and nausea that started Sunday, approximately 3 days ago.  She denies any fever, chills, loss of smell or taste, body aches, shortness of breath, cough.  She has been at home and only around her husband.  She does report upper and periumbilical abdominal pain that seems like it is radiating into her back.  She denies any history or current reflux symptoms.  She is not vomiting but feels very nauseated and has no appetite.  She has not started any new medications she is not eating any new foods.  She does have a headache but this is not uncommon.  She has a lot of gas and bloating.  She continues to have bowel movements and denies any diarrhea constipation.  She denies any hematochezia or melena.  She has tried some Alka-Seltzer plus and Pepto-Bismol with little relief.  She is taken 1 ibuprofen during this time but nothing significant.  She does not drink alcohol..     .. Active Ambulatory Problems    Diagnosis Date Noted  . Anxiety 02/11/2011  . Fibromyalgia 11/05/2010  . Pure hypercholesterolemia 11/05/2010  . Essential hypertension 11/05/2010  . Avitaminosis D 11/05/2010  . Right arm pain 09/25/2015  . Right shoulder pain 09/25/2015  . Fall 09/25/2015  . Facet arthropathy, lumbar 09/25/2015  . Chronic left sacroiliac joint pain  09/25/2015  . DDD (degenerative disc disease), lumbar 09/25/2015  . Spinal stenosis of lumbar region 09/25/2015  . Chronic pain syndrome 09/25/2015  . Adhesive capsulitis 09/25/2015  . 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 Migraine headache without aura 12/21/2016  . Right-sided chest pain 12/21/2016  . Chronic intractable headache 12/21/2016  . Stress reaction 12/21/2016  . Depression, recurrent (HCC) 01/26/2017  . Class 2 obesity due to excess calories without serious comorbidity with body mass index (BMI) of 35.0 to 35.9 in adult 04/25/2017  . 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 Elevated liver enzymes 01/10/2018  . Pruritus 01/10/2018  . Dyslipidemia (high LDL; low HDL) 01/11/2018  . Pre-diabetes 01/11/2018  . New daily persistent headache 12/27/2018  . Insomnia 12/30/2018  . Non-restorative sleep 01/27/2019  . Frequent headaches 01/27/2019  . Fatigue 01/27/2019   Resolved Ambulatory Problems    Diagnosis Date Noted  . Aggrieved 05/29/2011  . Viral gastroenteritis 02/27/2016   Past Medical History:  Diagnosis Date  . Chronic headaches   . Facet arthropathy of spine 09/25/2015  . Hypertension    Reviewed med, allergies, problem list.      Observations/Objective: No acute distress.  Normal breathing.   .. Today's Vitals   06/07/19 1001  BP: 132/84  Pulse: 70  Temp: 98.6 F (37 C)  TempSrc: Oral  Weight: 248 lb (112.5 kg)  Height: 5\' 4"  (1.626 m)   Body mass index is 42.57 kg/m.    Assessment and Plan: Marland KitchenTamra was seen today for nausea and abdominal  pain.  Diagnoses and all orders for this visit:  Generalized abdominal pain -     sucralfate (CARAFATE) 1 g tablet; Take 1 tablet (1 g total) by mouth 4 (four) times daily -  with meals and at bedtime.  Nausea   Unclear etiology of abdominal pain and nausea. Differential diagnosis includes gastroenteritis vs gastritis vs pancreatitis vs covid infection.  No indication this could  be food poisoning.  I would like her to come by the office and get Covid testing in our drive-through process.  No history of GERD however this could be some gastritis. Denies any significant NSAID use and no alcohol use.  Carafate started 4 times a day.  Encouraged brat diet.  Encourage hydration.  If symptoms worsen go to urgent care for labs.  Follow Up Instructions:    I discussed the assessment and treatment plan with the patient. The patient was provided an opportunity to ask questions and all were answered. The patient agreed with the plan and demonstrated an understanding of the instructions.   The patient was advised to call back or seek an in-person evaluation if the symptoms worsen or if the condition fails to improve as anticipated.  I provided 15 minutes of non-face-to-face time during this encounter.   Iran Planas, PA-C

## 2019-07-01 ENCOUNTER — Other Ambulatory Visit: Payer: Self-pay | Admitting: Physician Assistant

## 2019-07-01 DIAGNOSIS — R1084 Generalized abdominal pain: Secondary | ICD-10-CM

## 2019-07-18 ENCOUNTER — Other Ambulatory Visit: Payer: Self-pay | Admitting: Physician Assistant

## 2019-07-18 DIAGNOSIS — G4700 Insomnia, unspecified: Secondary | ICD-10-CM

## 2019-12-22 ENCOUNTER — Other Ambulatory Visit: Payer: Self-pay | Admitting: Physician Assistant

## 2019-12-22 DIAGNOSIS — L299 Pruritus, unspecified: Secondary | ICD-10-CM

## 2020-01-15 ENCOUNTER — Other Ambulatory Visit: Payer: Self-pay | Admitting: Physician Assistant

## 2020-01-15 DIAGNOSIS — E7800 Pure hypercholesterolemia, unspecified: Secondary | ICD-10-CM

## 2020-01-15 DIAGNOSIS — I10 Essential (primary) hypertension: Secondary | ICD-10-CM

## 2020-01-15 DIAGNOSIS — Z0189 Encounter for other specified special examinations: Secondary | ICD-10-CM

## 2020-01-15 DIAGNOSIS — E6609 Other obesity due to excess calories: Secondary | ICD-10-CM

## 2020-01-15 DIAGNOSIS — E785 Hyperlipidemia, unspecified: Secondary | ICD-10-CM

## 2020-01-15 DIAGNOSIS — R5383 Other fatigue: Secondary | ICD-10-CM

## 2020-01-15 DIAGNOSIS — R7303 Prediabetes: Secondary | ICD-10-CM

## 2020-06-21 ENCOUNTER — Telehealth: Payer: Self-pay | Admitting: General Practice

## 2020-06-21 NOTE — Telephone Encounter (Signed)
Transition Care Management Unsuccessful Follow-up Telephone Call  Date of discharge and from where:  [facility] 06/20/20  Attempts:  1st Attempt  Reason for unsuccessful TCM follow-up call:  Left voice message    

## 2020-06-24 NOTE — Telephone Encounter (Signed)
Transition Care Management Unsuccessful Follow-up Telephone Call  Date of discharge and from where:  [facility] 06/20/20  Attempts:  2nd Attempt  Reason for unsuccessful TCM follow-up call:  Left voice message    

## 2020-06-26 NOTE — Telephone Encounter (Signed)
Transition Care Management Unsuccessful Follow-up Telephone Call  Date of discharge and from where:  [facility] 06/20/20  Attempts:  3rd Attempt  Reason for unsuccessful TCM follow-up call:  Left voice message    

## 2020-07-19 ENCOUNTER — Telehealth: Payer: Self-pay | Admitting: General Practice

## 2020-07-19 NOTE — Telephone Encounter (Signed)
Transition Care Management Unsuccessful Follow-up Telephone Call  Date of discharge and from where:  [facility] 07/18/20  Attempts:  1st Attempt  Reason for unsuccessful TCM follow-up call:  Left voice message

## 2020-07-22 NOTE — Telephone Encounter (Signed)
Transition Care Management Unsuccessful Follow-up Telephone Call  Date of discharge and from where:  [facility] 07/18/20  Attempts:  2nd Attempt  Reason for unsuccessful TCM follow-up call:  Left voice message

## 2020-07-24 NOTE — Telephone Encounter (Signed)
Transition Care Management Unsuccessful Follow-up Telephone Call  Date of discharge and from where:  [facility] 07/18/20  Attempts:  3rd Attempt  Reason for unsuccessful TCM follow-up call:  Left voice message

## 2020-07-29 ENCOUNTER — Telehealth: Payer: Self-pay | Admitting: General Practice

## 2020-07-29 NOTE — Telephone Encounter (Signed)
Transition Care Management Unsuccessful Follow-up Telephone Call  Date of discharge and from where:  [facility] 07/27/20  Attempts:  1st Attempt  Reason for unsuccessful TCM follow-up call:  Left voice message

## 2020-07-31 NOTE — Telephone Encounter (Signed)
Transition Care Management Unsuccessful Follow-up Telephone Call  Date of discharge and from where:  [facility] 07/27/20  Attempts:  2nd Attempt  Reason for unsuccessful TCM follow-up call:  Left voice message

## 2020-08-02 NOTE — Telephone Encounter (Signed)
Transition Care Management Unsuccessful Follow-up Telephone Call  Date of discharge and from where:  [facility] 07/27/20  Attempts:  3rd Attempt  Reason for unsuccessful TCM follow-up call:  Left voice message

## 2020-09-23 ENCOUNTER — Telehealth: Payer: Self-pay

## 2020-09-23 NOTE — Telephone Encounter (Signed)
Transition Care Management Unsuccessful Follow-up Telephone Call  Date of discharge and from where:  09/20/2020 from [facility]  Attempts:  1st Attempt  Reason for unsuccessful TCM follow-up call:  Left voice message    

## 2020-09-24 NOTE — Telephone Encounter (Addendum)
Transition Care Management Unsuccessful Follow-up Telephone Call  Date of discharge and from where:  09/820 from [facility]   Attempts:  2nd attempt  Reason for unsuccessful TCM follow-up call:  Left voice message

## 2020-09-25 NOTE — Telephone Encounter (Signed)
Transition Care Management Unsuccessful Follow-up Telephone Call  Date of discharge and from where:  09/20/2020 from [facility]  Attempts:  3rd Attempt  Reason for unsuccessful TCM follow-up call:  Left voice message

## 2022-04-16 ENCOUNTER — Telehealth: Payer: Self-pay

## 2022-04-16 NOTE — Telephone Encounter (Signed)
Transition Care Management Unsuccessful Follow-up Telephone Call  Date of discharge and from where:  [facility] 04/15/2022  Attempts:  1st Attempt  Reason for unsuccessful TCM follow-up call:  Left voice message [name], [place] Direct Dial [phone]

## 2022-04-17 NOTE — Telephone Encounter (Signed)
Transition Care Management Unsuccessful Follow-up Telephone Call  Date of discharge and from where:  Sheri King 04/15/2022  Attempts:  2nd Attempt  Reason for unsuccessful TCM follow-up call:  Missing or invalid number [name], [place] Direct Dial [phone]

## 2022-04-20 NOTE — Telephone Encounter (Signed)
Transition Care Management Unsuccessful Follow-up Telephone Call  Date of discharge and from where:  Sheri King 04/15/2022  Attempts:  3rd Attempt  Reason for unsuccessful TCM follow-up call:  Left voice message Sheri King, [name] Direct Dial [phone]

## 2022-06-15 DEATH — deceased
